# Patient Record
Sex: Male | Born: 1955 | Race: White | Hispanic: No | Marital: Married | Smoking: Former smoker
Health system: Southern US, Community
[De-identification: ages and names within clinical notes are randomized; demographics above are authoritative.]

## PROBLEM LIST (undated history)

## (undated) DIAGNOSIS — K219 Gastro-esophageal reflux disease without esophagitis: Secondary | ICD-10-CM

## (undated) DIAGNOSIS — I10 Essential (primary) hypertension: Secondary | ICD-10-CM

## (undated) DIAGNOSIS — F419 Anxiety disorder, unspecified: Secondary | ICD-10-CM

## (undated) DIAGNOSIS — Z9289 Personal history of other medical treatment: Secondary | ICD-10-CM

## (undated) DIAGNOSIS — M1610 Unilateral primary osteoarthritis, unspecified hip: Secondary | ICD-10-CM

## (undated) DIAGNOSIS — S069X9A Unspecified intracranial injury with loss of consciousness of unspecified duration, initial encounter: Secondary | ICD-10-CM

## (undated) DIAGNOSIS — M1990 Unspecified osteoarthritis, unspecified site: Secondary | ICD-10-CM

## (undated) DIAGNOSIS — J45909 Unspecified asthma, uncomplicated: Secondary | ICD-10-CM

## (undated) HISTORY — PX: LIPOMA EXCISION: SHX5283

---

## 1978-08-27 DIAGNOSIS — Z9289 Personal history of other medical treatment: Secondary | ICD-10-CM

## 1978-08-27 HISTORY — DX: Personal history of other medical treatment: Z92.89

## 1980-08-27 DIAGNOSIS — S069X9A Unspecified intracranial injury with loss of consciousness of unspecified duration, initial encounter: Secondary | ICD-10-CM

## 1980-08-27 HISTORY — PX: TIBIA FRACTURE SURGERY: SHX806

## 1980-08-27 HISTORY — DX: Unspecified intracranial injury with loss of consciousness of unspecified duration, initial encounter: S06.9X9A

## 1981-08-27 HISTORY — PX: TIBIA HARDWARE REMOVAL: SUR1133

## 2015-10-04 ENCOUNTER — Other Ambulatory Visit (HOSPITAL_COMMUNITY): Payer: Self-pay | Admitting: Orthopaedic Surgery

## 2015-10-07 ENCOUNTER — Encounter (HOSPITAL_COMMUNITY)
Admission: RE | Admit: 2015-10-07 | Discharge: 2015-10-07 | Disposition: A | Discharge status: Home / Self Care | Payer: Managed Care, Other (non HMO) | Source: Ambulatory Visit | Attending: Orthopaedic Surgery | Admitting: Orthopaedic Surgery

## 2015-10-07 ENCOUNTER — Encounter (HOSPITAL_COMMUNITY): Payer: Self-pay

## 2015-10-07 DIAGNOSIS — I10 Essential (primary) hypertension: Secondary | ICD-10-CM | POA: Diagnosis not present

## 2015-10-07 DIAGNOSIS — M879 Osteonecrosis, unspecified: Secondary | ICD-10-CM | POA: Diagnosis not present

## 2015-10-07 DIAGNOSIS — M1612 Unilateral primary osteoarthritis, left hip: Secondary | ICD-10-CM | POA: Insufficient documentation

## 2015-10-07 DIAGNOSIS — Z87891 Personal history of nicotine dependence: Secondary | ICD-10-CM | POA: Insufficient documentation

## 2015-10-07 DIAGNOSIS — Z01812 Encounter for preprocedural laboratory examination: Secondary | ICD-10-CM | POA: Insufficient documentation

## 2015-10-07 DIAGNOSIS — Z79899 Other long term (current) drug therapy: Secondary | ICD-10-CM | POA: Diagnosis not present

## 2015-10-07 DIAGNOSIS — Z7982 Long term (current) use of aspirin: Secondary | ICD-10-CM | POA: Diagnosis not present

## 2015-10-07 DIAGNOSIS — Z01818 Encounter for other preprocedural examination: Secondary | ICD-10-CM | POA: Insufficient documentation

## 2015-10-07 DIAGNOSIS — Z0183 Encounter for blood typing: Secondary | ICD-10-CM | POA: Insufficient documentation

## 2015-10-07 DIAGNOSIS — K219 Gastro-esophageal reflux disease without esophagitis: Secondary | ICD-10-CM | POA: Insufficient documentation

## 2015-10-07 DIAGNOSIS — I498 Other specified cardiac arrhythmias: Secondary | ICD-10-CM | POA: Insufficient documentation

## 2015-10-07 HISTORY — DX: Gastro-esophageal reflux disease without esophagitis: K21.9

## 2015-10-07 HISTORY — DX: Essential (primary) hypertension: I10

## 2015-10-07 HISTORY — DX: Unspecified osteoarthritis, unspecified site: M19.90

## 2015-10-07 LAB — CBC WITH DIFFERENTIAL/PLATELET
BASOS PCT: 0 %
Basophils Absolute: 0 10*3/uL (ref 0.0–0.1)
EOS ABS: 0.1 10*3/uL (ref 0.0–0.7)
Eosinophils Relative: 2 %
HCT: 47.5 % (ref 39.0–52.0)
HEMOGLOBIN: 15.5 g/dL (ref 13.0–17.0)
Lymphocytes Relative: 26 %
Lymphs Abs: 1.7 10*3/uL (ref 0.7–4.0)
MCH: 31.5 pg (ref 26.0–34.0)
MCHC: 32.6 g/dL (ref 30.0–36.0)
MCV: 96.5 fL (ref 78.0–100.0)
Monocytes Absolute: 0.5 10*3/uL (ref 0.1–1.0)
Monocytes Relative: 8 %
NEUTROS PCT: 64 %
Neutro Abs: 4.3 10*3/uL (ref 1.7–7.7)
Platelets: 285 10*3/uL (ref 150–400)
RBC: 4.92 MIL/uL (ref 4.22–5.81)
RDW: 13.5 % (ref 11.5–15.5)
WBC: 6.5 10*3/uL (ref 4.0–10.5)

## 2015-10-07 LAB — PROTIME-INR
INR: 1.02 (ref 0.00–1.49)
PROTHROMBIN TIME: 13.6 s (ref 11.6–15.2)

## 2015-10-07 LAB — COMPREHENSIVE METABOLIC PANEL
ALBUMIN: 3.7 g/dL (ref 3.5–5.0)
ALK PHOS: 84 U/L (ref 38–126)
ALT: 22 U/L (ref 17–63)
ANION GAP: 11 (ref 5–15)
AST: 23 U/L (ref 15–41)
BUN: 11 mg/dL (ref 6–20)
CALCIUM: 9.6 mg/dL (ref 8.9–10.3)
CO2: 26 mmol/L (ref 22–32)
CREATININE: 0.96 mg/dL (ref 0.61–1.24)
Chloride: 105 mmol/L (ref 101–111)
GFR calc Af Amer: >60 mL/min (ref >60)
GFR calc non Af Amer: >60 mL/min (ref >60)
GLUCOSE: 104 mg/dL — AB (ref 65–99)
Potassium: 4 mmol/L (ref 3.5–5.1)
SODIUM: 142 mmol/L (ref 135–145)
Total Bilirubin: 0.4 mg/dL (ref 0.3–1.2)
Total Protein: 6.9 g/dL (ref 6.5–8.1)

## 2015-10-07 LAB — SEDIMENTATION RATE: SED RATE: 10 mm/h (ref 0–16)

## 2015-10-07 LAB — URINALYSIS, ROUTINE W REFLEX MICROSCOPIC
BILIRUBIN URINE: NEGATIVE
Glucose, UA: NEGATIVE mg/dL
Hgb urine dipstick: NEGATIVE
KETONES UR: NEGATIVE mg/dL
LEUKOCYTES UA: NEGATIVE
Nitrite: NEGATIVE
PH: 7.5 (ref 5.0–8.0)
Protein, ur: NEGATIVE mg/dL
SPECIFIC GRAVITY, URINE: 1.018 (ref 1.005–1.030)

## 2015-10-07 LAB — TYPE AND SCREEN
ABO/RH(D): A POS
Antibody Screen: NEGATIVE

## 2015-10-07 LAB — ABO/RH: ABO/RH(D): A POS

## 2015-10-07 LAB — APTT: APTT: 29 s (ref 24–37)

## 2015-10-07 LAB — SURGICAL PCR SCREEN
MRSA, PCR: NEGATIVE
Staphylococcus aureus: NEGATIVE

## 2015-10-07 LAB — C-REACTIVE PROTEIN: CRP: 1 mg/dL — AB (ref <1.0)

## 2015-10-07 NOTE — Progress Notes (Signed)
Office called re: severe penicillin allergy as a child. Ancef ordered. Spoke with liz in office

## 2015-10-07 NOTE — Progress Notes (Signed)
   10/07/15 1325  OBSTRUCTIVE SLEEP APNEA  Have you ever been diagnosed with sleep apnea through a sleep study? No  Do you snore loudly (loud enough to be heard through closed doors)?  1  Do you often feel tired, fatigued, or sleepy during the daytime (such as falling asleep during driving or talking to someone)? 0  Has anyone observed you stop breathing during your sleep? 0  Do you have, or are you being treated for high blood pressure? 1  BMI more than 35 kg/m2? 1  Age > 50 (1-yes) 1  Neck circumference greater than:Male 16 inches or larger, Male 17inches or larger? 1 (45)  Male Gender (Yes=1) 1  Obstructive Sleep Apnea Score 6  Score 5 or greater  Results sent to PCP

## 2015-10-07 NOTE — Pre-Procedure Instructions (Addendum)
Micheal Anderson  10/07/2015      [id number] - [place], [place] - [address] [place] [address] Phone: [phone] Fax: [phone]    Your procedure is scheduled on 10/13/15  Report to [facility] Admitting at 1015 A.M.  Call this number if you have problems the morning of surgery:  [phone]   Remember:  Do not eat food or drink liquids after midnight.  Take these medicines the morning of surgery with A SIP OF WATER zantac   STOP all herbel meds, nsaids (aleve,naproxen,advil,ibuprofen) starting TODAY including aspirin, all vitamins, and herbal meds(peppogest, cinnamon,magnilife, coq10, garlic,hawthorn berry ,krill oil, multi vit, milk thistle,tumeric,saw palmetto)   Do not wear jewelry, make-up or nail polish.  Do not wear lotions, powders, or perfumes.  You may wear deodorant.  Do not shave 48 hours prior to surgery.  Men may shave face and neck.  Do not bring valuables to the hospital.  [facility] is not responsible for any belongings or valuables.  Contacts, dentures or bridgework may not be worn into surgery.  Leave your suitcase in the car.  After surgery it may be brought to your room.  For patients admitted to the hospital, discharge time will be determined by your treatment team.  Patients discharged the day of surgery will not be allowed to drive home.   Name and phone number of your driver:    Special instructions:   Special Instructions: [place] - Preparing for Surgery  Before surgery, you can play an important role.  Because skin is not sterile, your skin needs to be as free of germs as possible.  You can reduce the number of germs on you skin by washing with CHG (chlorahexidine gluconate) soap before surgery.  CHG is an antiseptic cleaner which kills germs and bonds with the skin to continue killing germs even after washing.  Please DO NOT use if you have an allergy to CHG or antibacterial soaps.  If your skin  becomes reddened/irritated stop using the CHG and inform your nurse when you arrive at Short Stay.  Do not shave (including legs and underarms) for at least 48 hours prior to the first CHG shower.  You may shave your face.  Please follow these instructions carefully:   1.  Shower with CHG Soap the night before surgery and the morning of Surgery.  2.  If you choose to wash your hair, wash your hair first as usual with your normal shampoo.  3.  After you shampoo, rinse your hair and body thoroughly to remove the Shampoo.  4.  Use CHG as you would any other liquid soap.  You can apply chg directly  to the skin and wash gently with scrungie or a clean washcloth.  5.  Apply the CHG Soap to your body ONLY FROM THE NECK DOWN.  Do not use on open wounds or open sores.  Avoid contact with your eyes ears, mouth and genitals (private parts).  Wash genitals (private parts)       with your normal soap.  6.  Wash thoroughly, paying special attention to the area where your surgery will be performed.  7.  Thoroughly rinse your body with warm water from the neck down.  8.  DO NOT shower/wash with your normal soap after using and rinsing off the CHG Soap.  9.  Pat yourself dry with a clean towel.  10.  Wear clean pajamas.            11.  Place clean sheets on your bed the night of your first shower and do not sleep with pets.  Day of Surgery  Do not apply any lotions/deodorants the morning of surgery.  Please wear clean clothes to the hospital/surgery center.  Please read over the following fact sheets that you were given. Pain Booklet, Coughing and Deep Breathing, Blood Transfusion Information, Total Joint Packet, MRSA Information and Surgical Site Infection Prevention

## 2015-10-10 ENCOUNTER — Other Ambulatory Visit (HOSPITAL_COMMUNITY): Payer: Self-pay | Admitting: Orthopaedic Surgery

## 2015-10-10 NOTE — Progress Notes (Signed)
Anesthesia chart review: Patient is a 60 year old male scheduled for left THA, anterior approach on 10/13/2015 by Dr. Roda Shutters.   History includes former smoker (quit 04/06/15), HTN, GERD, arthritis, childhood asthma. BMI is consistent with obesity. OSA screening score was 6. PCP is listed as Dr. Allyson Sabal [phone]), first/only visit on 09/08/15 with Clovia Cuff, PA-C for HTN and hip pain.   Meds include aspirin, HCTZ, krill oil, potassium, Zantac, saw palmetto, tramadol, multiple herbal supplements (instructed to hold).  PAT Vitals: BP 171/80, HR 102, RR 20, T 36.5C, O2 sat 95%.  10/07/15 EKG: Sinus rhythm at 84 bpm with marked sinus arrhythmia, anterior septal infarct, age undetermined. Rate was more regular and PVC on his comparison tracing from 09/08/15 (PCP).   Preoperative labs noted.  Reviewed above with anesthesiologist Dr. Noreene Larsson. If patient remains asymptomatic from a CV standpoint then it is anticipated that he can proceed as planned.  Velna Ochs [facility] Short Stay Center/Anesthesiology Phone [phone] 10/10/2015 5:14 PM

## 2015-10-12 MED ORDER — BUPIVACAINE LIPOSOME 1.3 % IJ SUSP
20.0000 mL | Freq: Once | INTRAMUSCULAR | Status: DC
  Filled 2015-10-12: qty 20

## 2015-10-12 MED ORDER — VANCOMYCIN HCL 10 G IV SOLR
1500.0000 mg | INTRAVENOUS | Status: AC
  Administered 2015-10-13 (×2): 1500 mg via INTRAVENOUS
  Filled 2015-10-12: qty 1500

## 2015-10-12 MED ORDER — CHLORHEXIDINE GLUCONATE 4 % EX LIQD: 60.0000 mL | Freq: Once | CUTANEOUS | Status: DC

## 2015-10-12 MED ORDER — TRANEXAMIC ACID 1000 MG/10ML IV SOLN
1000.0000 mg | INTRAVENOUS | Status: AC
  Administered 2015-10-13: 1000 mg via INTRAVENOUS
  Filled 2015-10-12: qty 10

## 2015-10-13 ENCOUNTER — Inpatient Hospital Stay (HOSPITAL_COMMUNITY): Payer: Managed Care, Other (non HMO)

## 2015-10-13 ENCOUNTER — Inpatient Hospital Stay (HOSPITAL_COMMUNITY)
Admission: RE | Admit: 2015-10-13 | Discharge: 2015-10-15 | DRG: 470 | Disposition: A | Discharge status: Home Health | Payer: Managed Care, Other (non HMO) | Source: Ambulatory Visit | Attending: Orthopaedic Surgery | Admitting: Orthopaedic Surgery

## 2015-10-13 ENCOUNTER — Encounter (HOSPITAL_COMMUNITY): Payer: Self-pay | Admitting: Certified Registered Nurse Anesthetist

## 2015-10-13 ENCOUNTER — Encounter (HOSPITAL_COMMUNITY)
Admission: RE | Disposition: A | Discharge status: Home Health | Payer: Self-pay | Source: Ambulatory Visit | Attending: Orthopaedic Surgery

## 2015-10-13 ENCOUNTER — Inpatient Hospital Stay (HOSPITAL_COMMUNITY): Payer: Managed Care, Other (non HMO) | Admitting: Certified Registered Nurse Anesthetist

## 2015-10-13 ENCOUNTER — Inpatient Hospital Stay (HOSPITAL_COMMUNITY): Payer: Managed Care, Other (non HMO) | Admitting: Vascular Surgery

## 2015-10-13 DIAGNOSIS — M1612 Unilateral primary osteoarthritis, left hip: Principal | ICD-10-CM | POA: Diagnosis present

## 2015-10-13 DIAGNOSIS — Z79899 Other long term (current) drug therapy: Secondary | ICD-10-CM

## 2015-10-13 DIAGNOSIS — M25552 Pain in left hip: Secondary | ICD-10-CM | POA: Diagnosis present

## 2015-10-13 DIAGNOSIS — I10 Essential (primary) hypertension: Secondary | ICD-10-CM | POA: Diagnosis present

## 2015-10-13 DIAGNOSIS — Z96642 Presence of left artificial hip joint: Secondary | ICD-10-CM

## 2015-10-13 DIAGNOSIS — Z7982 Long term (current) use of aspirin: Secondary | ICD-10-CM | POA: Diagnosis not present

## 2015-10-13 DIAGNOSIS — Z87891 Personal history of nicotine dependence: Secondary | ICD-10-CM

## 2015-10-13 DIAGNOSIS — K219 Gastro-esophageal reflux disease without esophagitis: Secondary | ICD-10-CM | POA: Diagnosis present

## 2015-10-13 DIAGNOSIS — M1611 Unilateral primary osteoarthritis, right hip: Secondary | ICD-10-CM | POA: Diagnosis present

## 2015-10-13 DIAGNOSIS — Z96649 Presence of unspecified artificial hip joint: Secondary | ICD-10-CM

## 2015-10-13 DIAGNOSIS — D62 Acute posthemorrhagic anemia: Secondary | ICD-10-CM | POA: Diagnosis not present

## 2015-10-13 DIAGNOSIS — M879 Osteonecrosis, unspecified: Secondary | ICD-10-CM | POA: Diagnosis present

## 2015-10-13 DIAGNOSIS — Z419 Encounter for procedure for purposes other than remedying health state, unspecified: Secondary | ICD-10-CM

## 2015-10-13 HISTORY — PX: TOTAL HIP ARTHROPLASTY: SHX124

## 2015-10-13 HISTORY — DX: Unspecified asthma, uncomplicated: J45.909

## 2015-10-13 SURGERY — ARTHROPLASTY, HIP, TOTAL, ANTERIOR APPROACH
Anesthesia: Monitor Anesthesia Care | Site: Hip | Laterality: Left

## 2015-10-13 MED ORDER — FENTANYL CITRATE (PF) 100 MCG/2ML IJ SOLN
INTRAMUSCULAR | Status: DC | PRN
  Administered 2015-10-13: 25 ug via INTRAVENOUS
  Administered 2015-10-13: 50 ug via INTRAVENOUS
  Administered 2015-10-13: 25 ug via INTRAVENOUS
  Administered 2015-10-13: 50 ug via INTRAVENOUS

## 2015-10-13 MED ORDER — ALUM & MAG HYDROXIDE-SIMETH 200-200-20 MG/5ML PO SUSP
30.0000 mL | ORAL | Status: DC | PRN
  Administered 2015-10-13: 30 mL via ORAL
  Filled 2015-10-13: qty 30

## 2015-10-13 MED ORDER — HYDROMORPHONE HCL 1 MG/ML IJ SOLN: 0.2500 mg | INTRAMUSCULAR | Status: DC | PRN

## 2015-10-13 MED ORDER — MORPHINE SULFATE (PF) 2 MG/ML IV SOLN
1.0000 mg | INTRAVENOUS | Status: DC | PRN
  Administered 2015-10-13: 1 mg via INTRAVENOUS
  Filled 2015-10-13: qty 1

## 2015-10-13 MED ORDER — SORBITOL 70 % SOLN: 30.0000 mL | Freq: Every day | Status: DC | PRN

## 2015-10-13 MED ORDER — ACETAMINOPHEN 160 MG/5ML PO SOLN: 325.0000 mg | ORAL | Status: DC | PRN

## 2015-10-13 MED ORDER — SODIUM CHLORIDE 0.9 % IV SOLN
INTRAVENOUS | Status: DC
  Administered 2015-10-13: 22:00:00 via INTRAVENOUS

## 2015-10-13 MED ORDER — DIPHENHYDRAMINE HCL 12.5 MG/5ML PO ELIX
25.0000 mg | ORAL_SOLUTION | ORAL | Status: DC | PRN
  Administered 2015-10-13: 25 mg via ORAL
  Filled 2015-10-13: qty 10

## 2015-10-13 MED ORDER — ACETAMINOPHEN 500 MG PO TABS
1000.0000 mg | ORAL_TABLET | Freq: Four times a day (QID) | ORAL | Status: AC
  Filled 2015-10-13: qty 2

## 2015-10-13 MED ORDER — OXYCODONE HCL 5 MG PO TABS: 5.0000 mg | ORAL_TABLET | Freq: Once | ORAL | Status: DC | PRN

## 2015-10-13 MED ORDER — HYDROCHLOROTHIAZIDE 12.5 MG PO CAPS
12.5000 mg | ORAL_CAPSULE | Freq: Every day | ORAL | Status: DC
  Administered 2015-10-14 – 2015-10-15 (×2): 12.5 mg via ORAL
  Filled 2015-10-13 (×2): qty 1

## 2015-10-13 MED ORDER — OXYCODONE HCL ER 10 MG PO T12A
10.0000 mg | EXTENDED_RELEASE_TABLET | Freq: Two times a day (BID) | ORAL | Status: DC
  Administered 2015-10-13 – 2015-10-15 (×4): 10 mg via ORAL
  Filled 2015-10-13 (×4): qty 1

## 2015-10-13 MED ORDER — ONDANSETRON HCL 4 MG PO TABS: 4.0000 mg | ORAL_TABLET | Freq: Four times a day (QID) | ORAL | Status: DC | PRN

## 2015-10-13 MED ORDER — ACETAMINOPHEN 325 MG PO TABS: 325.0000 mg | ORAL_TABLET | ORAL | Status: DC | PRN

## 2015-10-13 MED ORDER — MIDAZOLAM HCL 2 MG/2ML IJ SOLN
INTRAMUSCULAR | Status: AC
  Filled 2015-10-13: qty 2

## 2015-10-13 MED ORDER — ACETAMINOPHEN 160 MG/5ML PO SOLN
325.0000 mg | ORAL | Status: DC | PRN
  Filled 2015-10-13: qty 20.3

## 2015-10-13 MED ORDER — METOCLOPRAMIDE HCL 5 MG PO TABS
5.0000 mg | ORAL_TABLET | Freq: Three times a day (TID) | ORAL | Status: DC | PRN
  Administered 2015-10-13: 10 mg via ORAL
  Filled 2015-10-13: qty 2

## 2015-10-13 MED ORDER — FAMOTIDINE 20 MG PO TABS
20.0000 mg | ORAL_TABLET | Freq: Every day | ORAL | Status: DC
  Administered 2015-10-14 – 2015-10-15 (×2): 20 mg via ORAL
  Filled 2015-10-13 (×2): qty 1

## 2015-10-13 MED ORDER — MIDAZOLAM HCL 5 MG/5ML IJ SOLN
INTRAMUSCULAR | Status: DC | PRN
  Administered 2015-10-13 (×2): 1 mg via INTRAVENOUS

## 2015-10-13 MED ORDER — MAGNESIUM CITRATE PO SOLN: 1.0000 | Freq: Once | ORAL | Status: DC | PRN

## 2015-10-13 MED ORDER — OXYCODONE HCL 5 MG/5ML PO SOLN: 5.0000 mg | Freq: Once | ORAL | Status: DC | PRN

## 2015-10-13 MED ORDER — ACETAMINOPHEN 325 MG PO TABS: 650.0000 mg | ORAL_TABLET | Freq: Four times a day (QID) | ORAL | Status: DC | PRN

## 2015-10-13 MED ORDER — DEXAMETHASONE SODIUM PHOSPHATE 10 MG/ML IJ SOLN
10.0000 mg | Freq: Once | INTRAMUSCULAR | Status: AC
  Administered 2015-10-14: 10 mg via INTRAVENOUS
  Filled 2015-10-13: qty 1

## 2015-10-13 MED ORDER — SODIUM CHLORIDE 0.9 % IR SOLN
Status: DC | PRN
  Administered 2015-10-13: 3000 mL

## 2015-10-13 MED ORDER — CELECOXIB 200 MG PO CAPS
200.0000 mg | ORAL_CAPSULE | Freq: Two times a day (BID) | ORAL | Status: DC
  Filled 2015-10-13: qty 1

## 2015-10-13 MED ORDER — METHOCARBAMOL 750 MG PO TABS: 750.0000 mg | ORAL_TABLET | Freq: Two times a day (BID) | ORAL | Status: AC | PRN

## 2015-10-13 MED ORDER — 0.9 % SODIUM CHLORIDE (POUR BTL) OPTIME
TOPICAL | Status: DC | PRN
  Administered 2015-10-13: 1000 mL

## 2015-10-13 MED ORDER — EPHEDRINE SULFATE 50 MG/ML IJ SOLN
INTRAMUSCULAR | Status: DC | PRN
  Administered 2015-10-13: 5 mg via INTRAVENOUS

## 2015-10-13 MED ORDER — ACETAMINOPHEN 650 MG RE SUPP: 650.0000 mg | Freq: Four times a day (QID) | RECTAL | Status: DC | PRN

## 2015-10-13 MED ORDER — POLYETHYLENE GLYCOL 3350 17 G PO PACK: 17.0000 g | PACK | Freq: Every day | ORAL | Status: DC | PRN

## 2015-10-13 MED ORDER — DEXTROSE 5 % IV SOLN
500.0000 mg | Freq: Four times a day (QID) | INTRAVENOUS | Status: DC | PRN
  Filled 2015-10-13: qty 5

## 2015-10-13 MED ORDER — ASPIRIN EC 325 MG PO TBEC
325.0000 mg | DELAYED_RELEASE_TABLET | Freq: Two times a day (BID) | ORAL | Status: DC
  Administered 2015-10-13 – 2015-10-15 (×4): 325 mg via ORAL
  Filled 2015-10-13 (×4): qty 1

## 2015-10-13 MED ORDER — PHENOL 1.4 % MT LIQD: 1.0000 | OROMUCOSAL | Status: DC | PRN

## 2015-10-13 MED ORDER — PHENYLEPHRINE HCL 10 MG/ML IJ SOLN
10.0000 mg | INTRAVENOUS | Status: DC | PRN
  Administered 2015-10-13: 15 ug/min via INTRAVENOUS

## 2015-10-13 MED ORDER — ONDANSETRON HCL 4 MG/2ML IJ SOLN
INTRAMUSCULAR | Status: DC | PRN
  Administered 2015-10-13: 4 mg via INTRAVENOUS

## 2015-10-13 MED ORDER — OXYCODONE HCL 5 MG PO TABS
5.0000 mg | ORAL_TABLET | ORAL | Status: DC | PRN
  Administered 2015-10-13 – 2015-10-15 (×6): 10 mg via ORAL
  Filled 2015-10-13 (×6): qty 2

## 2015-10-13 MED ORDER — BUPIVACAINE HCL (PF) 0.5 % IJ SOLN
INTRAMUSCULAR | Status: DC | PRN
  Administered 2015-10-13: 3 mL via INTRATHECAL

## 2015-10-13 MED ORDER — FENTANYL CITRATE (PF) 250 MCG/5ML IJ SOLN
INTRAMUSCULAR | Status: AC
  Filled 2015-10-13: qty 5

## 2015-10-13 MED ORDER — OXYCODONE-ACETAMINOPHEN 5-325 MG PO TABS: 1.0000 | ORAL_TABLET | ORAL | Status: DC | PRN

## 2015-10-13 MED ORDER — OXYCODONE HCL 5 MG PO TABS: 5.0000 mg | ORAL_TABLET | ORAL | Status: DC | PRN

## 2015-10-13 MED ORDER — TRANEXAMIC ACID 1000 MG/10ML IV SOLN
2000.0000 mg | INTRAVENOUS | Status: AC
  Administered 2015-10-13: 2000 mg via TOPICAL
  Filled 2015-10-13: qty 20

## 2015-10-13 MED ORDER — ONDANSETRON HCL 4 MG PO TABS: 4.0000 mg | ORAL_TABLET | Freq: Three times a day (TID) | ORAL | Status: DC | PRN

## 2015-10-13 MED ORDER — POVIDONE-IODINE 10 % EX SOLN
CUTANEOUS | Status: DC | PRN
  Administered 2015-10-13: 1 via TOPICAL

## 2015-10-13 MED ORDER — POTASSIUM 99 MG PO TABS: 1.0000 | ORAL_TABLET | Freq: Every day | ORAL | Status: DC

## 2015-10-13 MED ORDER — ASPIRIN EC 325 MG PO TBEC: 325.0000 mg | DELAYED_RELEASE_TABLET | Freq: Two times a day (BID) | ORAL | Status: DC

## 2015-10-13 MED ORDER — VANCOMYCIN HCL 10 G IV SOLR
1500.0000 mg | Freq: Two times a day (BID) | INTRAVENOUS | Status: AC
  Administered 2015-10-13 – 2015-10-14 (×2): 1500 mg via INTRAVENOUS
  Filled 2015-10-13 (×2): qty 1500

## 2015-10-13 MED ORDER — GLYCOPYRROLATE 0.2 MG/ML IJ SOLN
INTRAMUSCULAR | Status: DC | PRN
  Administered 2015-10-13: 0.2 mg via INTRAVENOUS

## 2015-10-13 MED ORDER — ONDANSETRON HCL 4 MG/2ML IJ SOLN: 4.0000 mg | Freq: Four times a day (QID) | INTRAMUSCULAR | Status: DC | PRN

## 2015-10-13 MED ORDER — MENTHOL 3 MG MT LOZG: 1.0000 | LOZENGE | OROMUCOSAL | Status: DC | PRN

## 2015-10-13 MED ORDER — SENNOSIDES-DOCUSATE SODIUM 8.6-50 MG PO TABS: 1.0000 | ORAL_TABLET | Freq: Every evening | ORAL | Status: DC | PRN

## 2015-10-13 MED ORDER — TRANEXAMIC ACID 1000 MG/10ML IV SOLN
1000.0000 mg | Freq: Once | INTRAVENOUS | Status: AC
  Administered 2015-10-13: 1000 mg via INTRAVENOUS
  Filled 2015-10-13: qty 10

## 2015-10-13 MED ORDER — KETOROLAC TROMETHAMINE 30 MG/ML IJ SOLN
30.0000 mg | Freq: Four times a day (QID) | INTRAMUSCULAR | Status: AC | PRN
  Administered 2015-10-13: 30 mg via INTRAVENOUS
  Filled 2015-10-13: qty 1

## 2015-10-13 MED ORDER — PROPOFOL 500 MG/50ML IV EMUL
INTRAVENOUS | Status: DC | PRN
  Administered 2015-10-13: 50 ug/kg/min via INTRAVENOUS

## 2015-10-13 MED ORDER — METOCLOPRAMIDE HCL 5 MG/ML IJ SOLN: 5.0000 mg | Freq: Three times a day (TID) | INTRAMUSCULAR | Status: DC | PRN

## 2015-10-13 MED ORDER — LACTATED RINGERS IV SOLN
INTRAVENOUS | Status: DC
  Administered 2015-10-13 (×3): via INTRAVENOUS

## 2015-10-13 MED ORDER — METHOCARBAMOL 500 MG PO TABS
500.0000 mg | ORAL_TABLET | Freq: Four times a day (QID) | ORAL | Status: DC | PRN
Start: 2015-10-13 — End: 2015-10-15
  Administered 2015-10-13 – 2015-10-15 (×3): 500 mg via ORAL
  Filled 2015-10-13 (×3): qty 1

## 2015-10-13 SURGICAL SUPPLY — 51 items
BENZOIN TINCTURE PRP APPL 2/3 (GAUZE/BANDAGES/DRESSINGS) ×3 IMPLANT
BNDG COHESIVE 6X5 TAN STRL LF (GAUZE/BANDAGES/DRESSINGS) ×3 IMPLANT
CAPT HIP TOTAL 2 ×3 IMPLANT
CELLS DAT CNTRL 66122 CELL SVR (MISCELLANEOUS) ×1 IMPLANT
CLOSURE STERI-STRIP 1/2X4 (GAUZE/BANDAGES/DRESSINGS) ×1
CLSR STERI-STRIP ANTIMIC 1/2X4 (GAUZE/BANDAGES/DRESSINGS) ×2 IMPLANT
COVER SURGICAL LIGHT HANDLE (MISCELLANEOUS) ×3 IMPLANT
DRAPE C-ARM 42X72 X-RAY (DRAPES) ×3 IMPLANT
DRAPE IMP U-DRAPE 54X76 (DRAPES) ×3 IMPLANT
DRAPE STERI IOBAN 125X83 (DRAPES) ×3 IMPLANT
DRAPE U-SHAPE 47X51 STRL (DRAPES) ×9 IMPLANT
DRSG AQUACEL AG ADV 3.5X10 (GAUZE/BANDAGES/DRESSINGS) ×3 IMPLANT
DRSG MEPILEX BORDER 4X8 (GAUZE/BANDAGES/DRESSINGS) IMPLANT
DURAPREP 26ML APPLICATOR (WOUND CARE) ×3 IMPLANT
ELECT BLADE 4.0 EZ CLEAN MEGAD (MISCELLANEOUS) ×3
ELECT REM PT RETURN 9FT ADLT (ELECTROSURGICAL) ×3
ELECTRODE BLDE 4.0 EZ CLN MEGD (MISCELLANEOUS) ×1 IMPLANT
ELECTRODE REM PT RTRN 9FT ADLT (ELECTROSURGICAL) ×1 IMPLANT
FACESHIELD WRAPAROUND (MASK) ×3 IMPLANT
GLOVE SKINSENSE NS SZ7.5 (GLOVE) ×4
GLOVE SKINSENSE STRL SZ7.5 (GLOVE) ×2 IMPLANT
GLOVE SURG SYN 7.5  E (GLOVE) ×2
GLOVE SURG SYN 7.5 E (GLOVE) ×1 IMPLANT
GOWN SRG XL XLNG 56XLVL 4 (GOWN DISPOSABLE) ×1 IMPLANT
GOWN STRL NON-REIN XL XLG LVL4 (GOWN DISPOSABLE) ×2
GOWN STRL REUS W/ TWL LRG LVL3 (GOWN DISPOSABLE) IMPLANT
GOWN STRL REUS W/TWL LRG LVL3 (GOWN DISPOSABLE)
HANDPIECE INTERPULSE COAX TIP (DISPOSABLE) ×2
KIT BASIN OR (CUSTOM PROCEDURE TRAY) ×3 IMPLANT
MARKER SKIN DUAL TIP RULER LAB (MISCELLANEOUS) ×3 IMPLANT
PACK TOTAL JOINT (CUSTOM PROCEDURE TRAY) ×3 IMPLANT
PACK UNIVERSAL I (CUSTOM PROCEDURE TRAY) ×3 IMPLANT
PADDING CAST COTTON 6X4 STRL (CAST SUPPLIES) ×3 IMPLANT
RTRCTR WOUND ALEXIS 18CM MED (MISCELLANEOUS) ×3
SAW OSC TIP CART 19.5X105X1.3 (SAW) ×3 IMPLANT
SEALER BIPOLAR AQUA 6.0 (INSTRUMENTS) IMPLANT
SET HNDPC FAN SPRY TIP SCT (DISPOSABLE) ×1 IMPLANT
SOLUTION BETADINE 4OZ (MISCELLANEOUS) ×3 IMPLANT
SUT ETHIBOND 2 V 37 (SUTURE) ×3 IMPLANT
SUT ETHIBOND NAB CT1 #1 30IN (SUTURE) ×9 IMPLANT
SUT ETHILON 3 0 FSL (SUTURE) ×3 IMPLANT
SUT VIC AB 0 CT1 27 (SUTURE) ×2
SUT VIC AB 0 CT1 27XBRD ANBCTR (SUTURE) ×1 IMPLANT
SUT VIC AB 1 CT1 27 (SUTURE) ×2
SUT VIC AB 1 CT1 27XBRD ANBCTR (SUTURE) ×1 IMPLANT
SUT VIC AB 2-0 CT1 27 (SUTURE) ×4
SUT VIC AB 2-0 CT1 TAPERPNT 27 (SUTURE) ×2 IMPLANT
SYR 20CC LL (SYRINGE) ×3 IMPLANT
TOWEL OR 17X26 10 PK STRL BLUE (TOWEL DISPOSABLE) ×3 IMPLANT
TRAY FOLEY CATH 16FR SILVER (SET/KITS/TRAYS/PACK) ×3 IMPLANT
YANKAUER SUCT BULB TIP NO VENT (SUCTIONS) ×3 IMPLANT

## 2015-10-13 NOTE — Anesthesia Preprocedure Evaluation (Signed)
Anesthesia Evaluation  Patient identified by MRN, date of birth, ID band Patient awake    Reviewed: Allergy & Precautions, NPO status , Patient's Chart, lab work & pertinent test results  History of Anesthesia Complications Negative for: history of anesthetic complications  Airway Mallampati: II  TM Distance: >3 FB Neck ROM: Full    Dental  (+) Teeth Intact,    Pulmonary neg shortness of breath, neg COPD, neg recent URI, former smoker,    breath sounds clear to auscultation       Cardiovascular hypertension, Pt. on medications (-) angina(-) CHF  Rhythm:Regular     Neuro/Psych negative neurological ROS  negative psych ROS   GI/Hepatic Neg liver ROS, GERD  Medicated and Controlled,  Endo/Other  Morbid obesity  Renal/GU negative Renal ROS     Musculoskeletal  (+) Arthritis ,   Abdominal   Peds  Hematology   Anesthesia Other Findings   Reproductive/Obstetrics                             Anesthesia Physical Anesthesia Plan  ASA: II  Anesthesia Plan: MAC and Spinal   Post-op Pain Management:    Induction: Intravenous  Airway Management Planned: Natural Airway, Nasal Cannula and Simple Face Mask  Additional Equipment: None  Intra-op Plan:   Post-operative Plan:   Informed Consent: I have reviewed the patients History and Physical, chart, labs and discussed the procedure including the risks, benefits and alternatives for the proposed anesthesia with the patient or authorized representative who has indicated his/her understanding and acceptance.   Dental advisory given  Plan Discussed with: CRNA and Surgeon  Anesthesia Plan Comments:         Anesthesia Quick Evaluation

## 2015-10-13 NOTE — Op Note (Signed)
LEFT TOTAL HIP ARTHROPLASTY ANTERIOR APPROACH  Procedure Note Micheal Anderson   [id number]  Pre-op Diagnosis: left hip avascular necrosis, osteoarthritis     Post-op Diagnosis: same   Operative Procedures  1. Total hip replacement; Left hip; uncemented [id number]   Personnel  Surgeon(s): Tarry Kos, MD   Anesthesia: spinal  Prosthesis: Depuy Acetabulum: Pinnacle 56 mm Femur: Corail 15 Head: 36 mm size: +12 Bearing Type: Ceramic on +4 lateralized poly  Date of Service: 10/13/2015  Total Hip Arthroplasty (Anterior Approach) Op Note:  After informed consent was obtained and the operative extremity marked in the holding area, the patient was brought back to the operating room and placed supine on the HANA table. Next, the operative extremity was prepped and draped in normal sterile fashion. Surgical timeout occurred verifying patient identification, surgical site, surgical procedure and administration of antibiotics.  A modified anterior Smith-Peterson approach to the hip was performed, using the interval between tensor fascia lata and sartorius.  Dissection was carried bluntly down onto the anterior hip capsule. The lateral femoral circumflex vessels were identified and coagulated. A capsulotomy was performed and the capsular flaps tagged for later repair.  Fluoroscopy was utilized to prepare for the femoral neck cut. The neck osteotomy was performed. The femoral head was removed, the acetabular rim was cleared of soft tissue and attention was turned to reaming the acetabulum.  Sequential reaming was performed under fluoroscopic guidance. We reamed to a size 55 mm, and then impacted the acetabular shell. The liner was then placed after irrigation and attention turned to the femur.  After placing the femoral hook, the leg was taken to externally rotated, extended and adducted position taking care to perform soft tissue releases to allow for adequate mobilization of the femur. Soft tissue was  cleared from the shoulder of the greater trochanter and the hook elevator used to improve exposure of the proximal femur. Sequential broaching performed up to a size 15. Trial neck and head were placed. The leg was brought back up to neutral and the construct reduced. The position and sizing of components, offset and leg lengths were checked using fluoroscopy. Stability of the  construct was checked in extension and external rotation without any subluxation or impingement of prosthesis. We dislocated the prosthesis, dropped the leg back into position, removed trial components, and irrigated copiously. The final stem and head was then placed, the leg brought back up, the system reduced and fluoroscopy used to verify positioning.  We irrigated, obtained hemostasis and closed the capsule using #2 ethibond suture.  Dilute betadyne solution was used. The fascia was closed with #1 vicryl plus, the deep fat layer was closed with 0 vicryl, the subcutaneous layers closed with 2.0 Vicryl Plus and the skin closed with 4.0 monocryl and dermabond. A sterile dressing was applied. The patient was awakened in the operating room and taken to recovery in stable condition.  All sponge, needle, and instrument counts were correct at the end of the case.   Position: supine  Complications: none.  Time Out: performed   Drains/Packing: none  Estimated blood loss: see anesthesia record  Returned to Recovery Room: in good condition.   Antibiotics: yes   Mechanical VTE (DVT) Prophylaxis: sequential compression devices, TED thigh-high  Chemical VTE (DVT) Prophylaxis: aspirin   Fluid Replacement: see anesthesia record  Specimens Removed: 1 to pathology   Sponge and Instrument Count Correct? yes   PACU: portable radiograph - low AP   Admission: inpatient status, start PT &  OT POD#1  Plan/RTC: Return in 2 weeks for staple removal. Return in 6 weeks to see MD.  Weight Bearing/Load Lower Extremity: full  Hip  precautions: none Suture Removal: 10-14 days  Betadine to incision twice daily once dressing is removed on POD#7  N. Glee Arvin, MD [facility] [phone] 2:37 PM      Implant Name Type Inv. Item Serial No. Manufacturer Lot No. LRB No. Used  SCREW 6.5MMX25MM - [id number] Screw SCREW 6.5MMX25MM  DEPUY [id number] Left 1  PINNACLE ALTRX PLUS 4 N 36X56 - [id number] Hips PINNACLE ALTRX PLUS 4 N 36X56  DEPUY [id number] Left 1  PINNACLE ALTRX PLUS 4 N 36X56 - [id number] Hips PINNACLE ALTRX PLUS 4 N 36X56  DEPUY [id number] Left 1  HEAD CERAMIC BIO DELTA 36 - [id number] Hips HEAD CERAMIC BIO DELTA 36  DEPUY [id number] Left 1  SCREW 6.5MMX25MM - [id number] Screw SCREW 6.5MMX25MM  DEPUY [id number] Left 1  HEAD CERAMIC BIO DELTA 36 - [id number] Hips HEAD CERAMIC BIO DELTA 36   DEPUY [id number] Left 1

## 2015-10-13 NOTE — Transfer of Care (Signed)
Immediate Anesthesia Transfer of Care Note  Patient: Micheal Anderson  Procedure(s) Performed: Procedure(s): LEFT TOTAL HIP ARTHROPLASTY ANTERIOR APPROACH (Left)  Patient Location: PACU  Anesthesia Type:MAC and Spinal  Level of Consciousness: awake, alert  and oriented  Airway & Oxygen Therapy: Patient Spontanous Breathing and Patient connected to nasal cannula oxygen  Post-op Assessment: Report given to RN and Post -op Vital signs reviewed and stable  Post vital signs: Reviewed and stable  Last Vitals:  Filed Vitals:   10/13/15 1116  BP: 166/77  Pulse: 96  Temp: 36.8 C  Resp: 20    Complications: No apparent anesthesia complications

## 2015-10-13 NOTE — Anesthesia Procedure Notes (Addendum)
Date/Time: 10/13/2015 12:45 PM Performed by: Dairl Ponder Oxygen Delivery Method: Simple face mask   Spinal Patient location during procedure: OR Staffing Anesthesiologist: Earnestine Shipp Preanesthetic Checklist Completed: patient identified, surgical consent, pre-op evaluation, timeout performed, IV checked, risks and benefits discussed and monitors and equipment checked Spinal Block Patient position: sitting Prep: site prepped and draped and DuraPrep Patient monitoring: heart rate, cardiac monitor, continuous pulse ox and blood pressure Approach: midline Location: L3-4 Injection technique: single-shot Needle Needle type: Pencan  Needle gauge: 24 G Needle length: 10 cm Assessment Sensory level: T8

## 2015-10-13 NOTE — H&P (Signed)
PREOPERATIVE H&P  Chief Complaint: left hip avascular necrosis, osteoarthritis  HPI: Micheal Anderson is a 60 y.o. male who presents for surgical treatment of left hip avascular necrosis, osteoarthritis.  He denies any changes in medical history.  Past Medical History  Diagnosis Date  . Hypertension   . Asthma     as a child  . GERD (gastroesophageal reflux disease)   . Arthritis    Past Surgical History  Procedure Laterality Date  . Fracture surgery  82,83    rt leg pins, pins removed later surgery   Social History   Social History  . Marital Status: Married    Spouse Name: N/A  . Number of Children: N/A  . Years of Education: N/A   Social History Main Topics  . Smoking status: Former Smoker -- 0.25 packs/day for 40 years    Types: Cigarettes    Quit date: 04/06/2015  . Smokeless tobacco: Not on file  . Alcohol Use: 5.4 oz/week    9 Glasses of wine per week  . Drug Use: No  . Sexual Activity: Not on file   Other Topics Concern  . Not on file   Social History Narrative  . No narrative on file   No family history on file. Allergies  Allergen Reactions  . Penicillins Shortness Of Breath    Child-   Difficulty breathing, lips swell   Prior to Admission medications   Medication Sig Start Date End Date Taking? Authorizing Provider  aspirin 81 MG tablet Take 81 mg by mouth daily.   Yes Historical Provider, MD  Cinnamon 500 MG capsule Take 500 mg by mouth daily.   Yes Historical Provider, MD  co-enzyme Q-10 30 MG capsule Take 30 mg by mouth daily.   Yes Historical Provider, MD  GARLIC PO Take 1 capsule by mouth daily.   Yes Historical Provider, MD  Hawthorn Berry 565 MG CAPS Take 1 capsule by mouth 3 (three) times daily.   Yes Historical Provider, MD  hydrochlorothiazide (MICROZIDE) 12.5 MG capsule Take 12.5 mg by mouth daily. 09/08/15  Yes Historical Provider, MD  MEGARED OMEGA-3 KRILL OIL 500 MG CAPS Take 1 capsule by mouth daily.   Yes Historical Provider, MD    milk thistle 175 MG tablet Take 175 mg by mouth daily.   Yes Historical Provider, MD  Multiple Vitamin (MULTIVITAMIN) tablet Take 1 tablet by mouth daily.   Yes Historical Provider, MD  OVER THE COUNTER MEDICATION Take 1 capsule by mouth daily as needed (supplement). "Peppogest" supplement   Yes Historical Provider, MD  OVER THE COUNTER MEDICATION Take 1 capsule by mouth daily. "Magnilife" supplement for Leg pain   Yes Historical Provider, MD  Potassium 99 MG TABS Take 1 tablet by mouth daily.   Yes Historical Provider, MD  ranitidine (ZANTAC) 150 MG capsule Take 150 mg by mouth 2 (two) times daily as needed for heartburn.   Yes Historical Provider, MD  saw palmetto 80 MG capsule Take 80 mg by mouth 2 (two) times daily.   Yes Historical Provider, MD  traMADol (ULTRAM) 50 MG tablet Take 50 mg by mouth every 6 (six) hours as needed. pain 09/26/15  Yes Historical Provider, MD  TURMERIC PO Take 1 capsule by mouth daily.   Yes Historical Provider, MD     Positive ROS: All other systems have been reviewed and were otherwise negative with the exception of those mentioned in the HPI and as above.  Physical Exam: General: Alert, no  acute distress Cardiovascular: No pedal edema Respiratory: No cyanosis, no use of accessory musculature GI: abdomen soft Skin: No lesions in the area of chief complaint Neurologic: Sensation intact distally Psychiatric: Patient is competent for consent with normal mood and affect Lymphatic: no lymphedema  MUSCULOSKELETAL: exam stable  Assessment: left hip avascular necrosis, osteoarthritis  Plan: Plan for Procedure(s): LEFT TOTAL HIP ARTHROPLASTY ANTERIOR APPROACH  The risks benefits and alternatives were discussed with the patient including but not limited to the risks of nonoperative treatment, versus surgical intervention including infection, bleeding, nerve injury,  blood clots, cardiopulmonary complications, morbidity, mortality, among others, and they were  willing to proceed.   Cheral Almas, MD   10/13/2015 7:30 AM

## 2015-10-13 NOTE — Discharge Instructions (Signed)

## 2015-10-14 LAB — BASIC METABOLIC PANEL
Anion gap: 8 (ref 5–15)
BUN: 10 mg/dL (ref 6–20)
CHLORIDE: 104 mmol/L (ref 101–111)
CO2: 24 mmol/L (ref 22–32)
CREATININE: 0.84 mg/dL (ref 0.61–1.24)
Calcium: 8.4 mg/dL — ABNORMAL LOW (ref 8.9–10.3)
GFR calc non Af Amer: >60 mL/min (ref >60)
Glucose, Bld: 106 mg/dL — ABNORMAL HIGH (ref 65–99)
POTASSIUM: 4.1 mmol/L (ref 3.5–5.1)
SODIUM: 136 mmol/L (ref 135–145)

## 2015-10-14 LAB — CBC
HCT: 37.9 % — ABNORMAL LOW (ref 39.0–52.0)
HEMOGLOBIN: 12.7 g/dL — AB (ref 13.0–17.0)
MCH: 32.6 pg (ref 26.0–34.0)
MCHC: 33.5 g/dL (ref 30.0–36.0)
MCV: 97.2 fL (ref 78.0–100.0)
Platelets: 298 10*3/uL (ref 150–400)
RBC: 3.9 MIL/uL — AB (ref 4.22–5.81)
RDW: 13.5 % (ref 11.5–15.5)
WBC: 8 10*3/uL (ref 4.0–10.5)

## 2015-10-14 NOTE — Care Management Note (Signed)
Case Management Note  Patient Details  Name: Micheal Anderson MRN: [id number] Date of Birth: [birth year]  Subjective/Objective:        S/p left total hip arthroplasty            Action/Plan: Spoke with patient about discharge plan. He chose [facility]. Contacted [facility] at [facility] and set up HHPT. Contacted James with [facility], rolling walker and 3N1 delivered to patient's room. Patient stated that his wife and daughter will be bale to assist him after discharge.      Expected Discharge Date:                  Expected Discharge Plan:  Home w Home Health Services  In-House Referral:  NA  Discharge planning Services  CM Consult  Post Acute Care Choice:  Durable Medical Equipment, Home Health Choice offered to:  Patient  DME Arranged:  3-N-1, Walker rolling DME Agency:     HH Arranged:  PT HH Agency:  [facility]  Status of Service:  Completed, signed off  Medicare Important Message Given:    Date Medicare IM Given:    Medicare IM give by:    Date Additional Medicare IM Given:    Additional Medicare Important Message give by:     If discussed at Long Length of Stay Meetings, dates discussed:    Additional Comments:  Monica Becton, RN 10/14/2015, 4:07 PM

## 2015-10-14 NOTE — Progress Notes (Signed)
Utilization review completed.  

## 2015-10-14 NOTE — Progress Notes (Signed)
   Subjective:  Patient reports pain as moderate. Did well with PT  Objective:   VITALS:   Filed Vitals:   10/13/15 1650 10/13/15 2102 10/14/15 0105 10/14/15 0445  BP: 131/77 136/57 137/73 125/66  Pulse: 62 64 70 71  Temp: 97.7 F (36.5 C) 98.5 F (36.9 C) 98.9 F (37.2 C) 98.9 F (37.2 C)  TempSrc: Oral Oral Oral Oral  Resp: Height:      Weight:      SpO2: 100% 98% 99% 99%    Neurologically intact Neurovascular intact Sensation intact distally Intact pulses distally Dorsiflexion/Plantar flexion intact Incision: dressing C/D/I and no drainage No cellulitis present Compartment soft   Lab Results  Component Value Date   WBC 8.0 10/14/2015   HGB 12.7* 10/14/2015   HCT 37.9* 10/14/2015   MCV 97.2 10/14/2015   PLT 298 10/14/2015     Assessment/Plan:  1 Day Post-Op   - Expected postop acute blood loss anemia - will monitor for symptoms - Up with PT/OT - DVT ppx - SCDs, ambulation, asa - WBAT operative extremity - foley out - Pain control - Discharge planning - anticipate dc sat with HHPT  Cheral Almas 10/14/2015, 10:25 AM [phone]

## 2015-10-14 NOTE — Clinical Social Work Note (Signed)
CSW received referral for SNF.  Case discussed with case manager, and plan is to discharge home with home health.  CSW to sign off please re-consult if social work needs arise.  Jalesha Plotz R. Torey Regan, MSW, LCSWA [phone]  

## 2015-10-14 NOTE — Progress Notes (Signed)
   10/14/15 1100  Clinical Encounter Type  Visited With Patient  Visit Type Spiritual support  Referral From Nurse  Spiritual Encounters  Spiritual Needs Prayer  Stress Factors  Patient Stress Factors Health changes  Chaplain visited with patient who feels he is recovering nicely and has good support system at home. Chaplain prayed with patient.

## 2015-10-14 NOTE — Progress Notes (Signed)
Patient with improved mobility during second PT session. Pt ambulating 100 feet with rw and min guard assist. Anticipate D/C home with family assist when medically appropriate. Will attempt stairs prior to D/C.    10/14/15 1531  PT Visit Information  Last PT Received On 10/14/15  Assistance Needed +1  History of Present Illness 60 y.o. 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 PMH: hypertension, arthritis  PT Time Calculation  PT Start Time (ACUTE ONLY) 1450  PT Stop Time (ACUTE ONLY) 1507  PT Time Calculation (min) (ACUTE ONLY) 17 min  Subjective Data  Subjective Doing a lot better this afternoon.   Precautions  Precautions Fall  Precaution Comments HEP provided, no hip precautions  Restrictions  Weight Bearing Restrictions Yes  LLE Weight Bearing WBAT  Pain Assessment  Pain Assessment 0-10  Pain Score 2  Pain Location Lt hip  Pain Descriptors / Indicators Sore  Pain Intervention(s) Limited activity within patient's tolerance;Monitored during session;Ice applied  Cognition  Arousal/Alertness Awake/alert  Behavior During Therapy WFL for tasks assessed/performed  Overall Cognitive Status Within Functional Limits for tasks assessed  Bed Mobility  General bed mobility comments up with OT upon arrival  Transfers  Overall transfer level Needs assistance  Equipment used Rolling walker (2 wheeled)  Transfers Sit to/from Stand  Sit to Stand Min guard  Ambulation/Gait  Ambulation/Gait assistance Min guard  Ambulation Distance (Feet) 100 Feet  Assistive device Rolling walker (2 wheeled)  Gait Pattern/deviations Step-through pattern;Decreased stance time - left  General Gait Details no LOB  Gait velocity decreased  Balance  Overall balance assessment Needs assistance  Sitting-balance support No upper extremity supported  Sitting balance-Leahy Scale Good  Standing balance support Bilateral upper extremity supported  Standing balance-Leahy Scale Poor  Standing  balance comment using rw  Exercises  Exercises Total Joint  Total Joint Exercises  Ankle Circles/Pumps AROM;Both;15 reps  Quad Sets Strengthening;Left;10 reps  Heel Slides AAROM;Left;10 reps  Hip ABduction/ADduction Strengthening;Left;10 reps  PT - End of Session  Equipment Utilized During Treatment Gait belt  Activity Tolerance Patient tolerated treatment well  Patient left in chair;with call bell/phone within reach  Nurse Communication Mobility status;Precautions  PT - Assessment/Plan  PT Plan Current plan remains appropriate  PT Frequency (ACUTE ONLY) 7X/week  Follow Up Recommendations Home health PT;Supervision for mobility/OOB  PT equipment Rolling walker with 5" wheels  PT Goal Progression  Progress towards PT goals Progressing toward goals  Acute Rehab PT Goals  PT Goal Formulation With patient  Time For Goal Achievement 10/28/15  Potential to Achieve Goals Good  PT General Charges  $$ ACUTE PT VISIT 1 Procedure  PT Treatments  $Gait Training 8-22 mins   Christiane Ha, PT, CSCS Pager [phone] Office [phone]

## 2015-10-14 NOTE — Progress Notes (Signed)
Occupational Therapy Evaluation Patient Details Name: Micheal Anderson MRN: [id number] DOB: 01/21/1956 Today's Date: 10/14/2015    History of Present Illness 60 y.o. 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 PMH: hypertension, arthritis   Clinical Impression   PTA, pt was independent with all ADLs, IADLs, and mobility. Pt currently presents with acute L hip pain and required min guard-min assist for functional transfers and LB ADLs. Educated pt on compensatory strategies for ADLs and to gradually increase activity level despite feeling good and less pain. Pt plans to d/c home with 24/7 assistance from his wife. Pt will benefit from continued acute OT to increase independence and safety with ADLs and mobility. Recommending 3in1 and RW for home use.    Follow Up Recommendations  No OT follow up;Supervision - Intermittent    Equipment Recommendations  3 in 1 bedside comode;Other (comment) (RW- 2 wheeled)    Recommendations for Other Services       Precautions / Restrictions Precautions Precautions: Fall Precaution Comments: no hip precautions Restrictions Weight Bearing Restrictions: Yes LLE Weight Bearing: Weight bearing as tolerated      Mobility Bed Mobility Overal bed mobility: Needs Assistance Bed Mobility: Supine to Sit     Supine to sit: Min guard     General bed mobility comments: HOB flat, no use of bedrails and height of bed raised to simulate home environment. Min guard assist for safety. Pt able to progress LLE without assist  Transfers Overall transfer level: Needs assistance Equipment used: Rolling walker (2 wheeled) Transfers: Sit to/from Stand Sit to Stand: Min guard         General transfer comment: Min guard for safety and balance. Verbal cues for safe hand placement on seated surfaces.    Balance Overall balance assessment: Needs assistance Sitting-balance support: No upper extremity supported;Feet supported Sitting balance-Leahy  Scale: Fair     Standing balance support: Bilateral upper extremity supported;During functional activity Standing balance-Leahy Scale: Fair Standing balance comment: using rw                            ADL Overall ADL's : Needs assistance/impaired     Grooming: Wash/dry hands;Min guard;Standing           Upper Body Dressing : Supervision/safety;Sitting   Lower Body Dressing: Minimal assistance;Sitting/lateral leans Lower Body Dressing Details (indicate cue type and reason): Unable to reach bilateral feet Toilet Transfer: Min guard;Cueing for safety;Ambulation;BSC;RW Toilet Transfer Details (indicate cue type and reason): BSC over toilet, verbal cues to feel BSC on back of legs before attempting to sit Toileting- Clothing Manipulation and Hygiene: Min guard;Sit to/from stand       Functional mobility during ADLs: Min guard;Rolling walker General ADL Comments: Reviewed no precautions for movement and began education on compensatory strategies for ADLs. Will need reinforcement to gradually increase activity level.     Vision Vision Assessment?: No apparent visual deficits   Perception     Praxis      Pertinent Vitals/Pain Pain Assessment: 0-10 Pain Score: 2  Pain Location: L hip Pain Descriptors / Indicators: Sore Pain Intervention(s): Limited activity within patient's tolerance;Monitored during session;Repositioned;RN gave pain meds during session     Hand Dominance Right   Extremity/Trunk Assessment Upper Extremity Assessment Upper Extremity Assessment: Overall WFL for tasks assessed   Lower Extremity Assessment Lower Extremity Assessment: LLE deficits/detail LLE Deficits / Details: decreased ROM and strength as expected post op   Cervical /  Trunk Assessment Cervical / Trunk Assessment: Normal   Communication Communication Communication: No difficulties   Cognition Arousal/Alertness: Awake/alert Behavior During Therapy: WFL for tasks  assessed/performed Overall Cognitive Status: Within Functional Limits for tasks assessed                     General Comments       Exercises       Shoulder Instructions      Home Living Family/patient expects to be discharged to:: Private residence Living Arrangements: Spouse/significant other Available Help at Discharge: Available 24 hours/day Type of Home: House Home Access: Stairs to enter [employer] of Steps: 4-5 Entrance Stairs-Rails: Right Home Layout: One level     Bathroom Shower/Tub: Walk-in shower;Door   [employer] Toilet: Standard     Home Equipment: Mudlogger: Sock aid        Prior Functioning/Environment Level of Independence: Independent        Comments: Works as a Production designer, theatre/television/film for [employer] and used to be a Chartered certified accountant Diagnosis: Acute pain   OT Problem List: Decreased range of motion;Decreased strength;Decreased activity tolerance;Impaired balance (sitting and/or standing);Decreased coordination;Decreased safety awareness;Decreased knowledge of use of DME or AE;Pain   OT Treatment/Interventions: Self-care/ADL training;Therapeutic exercise;DME and/or AE instruction;Energy conservation;Therapeutic activities;Patient/family education;Balance training    OT Goals(Current goals can be found in the care plan section) Acute Rehab OT Goals Patient Stated Goal: 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 OT Goal Formulation: With patient Time For Goal Achievement: 10/28/15 Potential to Achieve Goals: Good ADL Goals Pt Will Perform Lower Body Bathing: with modified independence;sitting/lateral leans;with adaptive equipment Pt Will Perform Lower Body Dressing: with modified independence;with adaptive equipment;sitting/lateral leans;sit to/from stand Pt Will Transfer to Toilet: with modified independence;ambulating;bedside commode (with BSC over toilet) Pt Will Perform Toileting - Clothing Manipulation  and hygiene: with modified independence;sitting/lateral leans;sit to/from stand Pt Will Perform Tub/Shower Transfer: Shower transfer;with supervision;ambulating;rolling walker  OT Frequency: Min 2X/week   Barriers to D/C:            Co-evaluation              End of Session Equipment Utilized During Treatment: Gait belt;Rolling walker Nurse Communication: Mobility status  Activity Tolerance: Patient tolerated treatment well Patient left: Other (comment) (with PT)   Time: 1426-1450 OT Time Calculation (min): 24 min Charges:  OT General Charges $OT Visit: 1 Procedure OT Evaluation $OT Eval Moderate Complexity: 1 Procedure OT Treatments $Self Care/Home Management : 8-22 mins G-Codes:    Nils Pyle, OTR/L Pager: [phone] 10/14/2015, 3:21 PM

## 2015-10-14 NOTE — Evaluation (Signed)
Physical Therapy Evaluation Patient Details Name: Micheal Anderson MRN: [id number] DOB: February 13, 1956 Today's Date: 10/14/2015   History of Present Illness  60 y.o. 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 PMH: hypertension, arthritis  Clinical Impression  Pt is s/p direct anterior THA resulting in the deficits listed below (see PT Problem List).  Pt will benefit from skilled PT to increase their independence and safety with mobility to allow discharge to the venue listed below. Patient able to ambulate 8 feet during initial session, will continue to progress as tolerated during future sessions. Anticipate pt will D/C to home with family support when medically stable.     Follow Up Recommendations Home health PT;Supervision for mobility/OOB    Equipment Recommendations  Rolling walker with 5" wheels    Recommendations for Other Services       Precautions / Restrictions Precautions Precautions: Fall Precaution Comments: no hip precautions Restrictions Weight Bearing Restrictions: Yes LLE Weight Bearing: Weight bearing as tolerated      Mobility  Bed Mobility Overal bed mobility: Needs Assistance Bed Mobility: Supine to Sit     Supine to sit: Min assist     General bed mobility comments: cues for sequence and assist with LLE  Transfers Overall transfer level: Needs assistance Equipment used: Rolling walker (2 wheeled) Transfers: Sit to/from Stand Sit to Stand: Min assist         General transfer comment: cues for hand position  Ambulation/Gait Ambulation/Gait assistance: Min guard Ambulation Distance (Feet): 8 Feet Assistive device: Rolling walker (2 wheeled) Gait Pattern/deviations: Step-to pattern;Decreased stance time - left Gait velocity: decreased   General Gait Details: no gross LOB, mild instability  Stairs            Wheelchair Mobility    Modified Rankin (Stroke Patients Only)       Balance Overall balance assessment: Needs  assistance Sitting-balance support: No upper extremity supported Sitting balance-Leahy Scale: Fair     Standing balance support: Bilateral upper extremity supported Standing balance-Leahy Scale: Poor Standing balance comment: using rw                             Pertinent Vitals/Pain Pain Assessment: 0-10 Pain Score: 3  Pain Location: Lt hip Pain Descriptors / Indicators: Aching Pain Intervention(s): Limited activity within patient's tolerance;Monitored during session    Home Living Family/patient expects to be discharged to:: Private residence Living Arrangements: Spouse/significant other Available Help at Discharge: Available 24 hours/day Type of Home: House Home Access: Stairs to enter Entrance Stairs-Rails: Right Entrance Stairs-Number of Steps: 4-5 Home Layout: One level Home Equipment: None      Prior Function Level of Independence: Independent               Hand Dominance        Extremity/Trunk Assessment   Upper Extremity Assessment: Defer to OT evaluation           Lower Extremity Assessment: LLE deficits/detail   LLE Deficits / Details: able to move LLE with min assist     Communication   Communication: No difficulties  Cognition Arousal/Alertness: Awake/alert Behavior During Therapy: WFL for tasks assessed/performed Overall Cognitive Status: Within Functional Limits for tasks assessed                      General Comments      Exercises        Assessment/Plan  PT Assessment Patient needs continued PT services  PT Diagnosis Difficulty walking   PT Problem List Decreased strength;Decreased range of motion;Decreased activity tolerance;Decreased balance;Decreased mobility;Pain  PT Treatment Interventions DME instruction;Gait training;Stair training;Functional mobility training;Therapeutic activities;Therapeutic exercise;Patient/family education   PT Goals (Current goals can be found in the Care Plan section)  Acute Rehab PT Goals Patient Stated Goal: be able to go back home PT Goal Formulation: With patient Time For Goal Achievement: 10/28/15 Potential to Achieve Goals: Good    Frequency 7X/week   Barriers to discharge        Co-evaluation               End of Session Equipment Utilized During Treatment: Gait belt Activity Tolerance: Patient tolerated treatment well Patient left: in chair;with call bell/phone within reach Nurse Communication: Mobility status;Precautions         Time: 1324-4010 PT Time Calculation (min) (ACUTE ONLY): 27 min   Charges:   PT Evaluation $PT Eval Moderate Complexity: 1 Procedure PT Treatments $Gait Training: 8-22 mins   PT G Codes:        Christiane Ha, PT, CSCS Pager [phone] Office [phone]  10/14/2015, 12:17 PM

## 2015-10-15 LAB — CBC
HCT: 41 % (ref 39.0–52.0)
HEMOGLOBIN: 13.2 g/dL (ref 13.0–17.0)
MCH: 31.1 pg (ref 26.0–34.0)
MCHC: 32.2 g/dL (ref 30.0–36.0)
MCV: 96.7 fL (ref 78.0–100.0)
PLATELETS: 322 10*3/uL (ref 150–400)
RBC: 4.24 MIL/uL (ref 4.22–5.81)
RDW: 13.1 % (ref 11.5–15.5)
WBC: 12.1 10*3/uL — AB (ref 4.0–10.5)

## 2015-10-15 NOTE — Discharge Summary (Signed)
2Physician Discharge Summary      Patient ID: RENATO SPELLMAN MRN: [id number] DOB/AGE: August 12, 1956 60 y.o.  Admit date: 10/13/2015 Discharge date: 10/15/2015  Admission Diagnoses:  <principal problem not specified>  Discharge Diagnoses:  Active Problems:   Osteoarthritis of left hip   Hip joint replacement status   Past Medical History  Diagnosis Date  . Hypertension   . GERD (gastroesophageal reflux disease)   . Childhood asthma   . Arthritis     "hips" (10/13/2015)    Surgeries: Procedure(s): LEFT TOTAL HIP ARTHROPLASTY ANTERIOR APPROACH on 10/13/2015   Consultants (if any):    Discharged Condition: Improved  Hospital Course: AUSTIN HERD is an 60 y.o. male who was admitted 10/13/2015 with a diagnosis of <principal problem not specified> and went to the operating room on 10/13/2015 and underwent the above named procedures.    He was given perioperative antibiotics:      Anti-infectives    Start     Dose/Rate Route Frequency Ordered Stop   10/13/15 2330  vancomycin (VANCOCIN) 1,500 mg in sodium chloride 0.9 % 500 mL IVPB     1,500 mg 250 mL/hr over 120 Minutes Intravenous Every 12 hours 10/13/15 1636 10/14/15 1344   10/13/15 1130  vancomycin (VANCOCIN) 1,500 mg in sodium chloride 0.9 % 500 mL IVPB     1,500 mg 250 mL/hr over 120 Minutes Intravenous To [facility] 10/12/15 0759 10/13/15 1228    .  He was given sequential compression devices, early ambulation, and aspirin for DVT prophylaxis.  He benefited maximally from the hospital stay and there were no complications.    Recent vital signs:  Filed Vitals:   10/14/15 2049 10/15/15 0511  BP: 109/66 130/65  Pulse: 77 58  Temp: 98.3 F (36.8 C) 98.3 F (36.8 C)  Resp: 18 16    Recent laboratory studies:  Lab Results  Component Value Date   HGB 13.2 10/15/2015   HGB 12.7* 10/14/2015   HGB 15.5 10/07/2015   Lab Results  Component Value Date   WBC 12.1* 10/15/2015   PLT 322 10/15/2015   Lab  Results  Component Value Date   INR 1.02 10/07/2015   Lab Results  Component Value Date   NA 136 10/14/2015   K 4.1 10/14/2015   CL 104 10/14/2015   CO2 24 10/14/2015   BUN 10 10/14/2015   CREATININE 0.84 10/14/2015   GLUCOSE 106* 10/14/2015    Discharge Medications:     Medication List    STOP taking these medications        aspirin 81 MG tablet  Replaced by:  aspirin EC 325 MG tablet      TAKE these medications        aspirin EC 325 MG tablet  Take 1 tablet (325 mg total) by mouth 2 (two) times daily.     Cinnamon 500 MG capsule  Take 500 mg by mouth daily.     co-enzyme Q-10 30 MG capsule  Take 30 mg by mouth daily.     GARLIC PO  Take 1 capsule by mouth daily.     Hawthorn Berry 565 MG Caps  Take 1 capsule by mouth 3 (three) times daily.     hydrochlorothiazide 12.5 MG capsule  Commonly known as:  MICROZIDE  Take 12.5 mg by mouth daily.     MEGARED OMEGA-3 KRILL OIL 500 MG Caps  Take 1 capsule by mouth daily.     methocarbamol 750 MG tablet  Commonly known  as:  ROBAXIN  Take 1 tablet (750 mg total) by mouth 2 (two) times daily as needed for muscle spasms.     milk thistle 175 MG tablet  Take 175 mg by mouth daily.     multivitamin tablet  Take 1 tablet by mouth daily.     ondansetron 4 MG tablet  Commonly known as:  ZOFRAN  Take 1-2 tablets (4-8 mg total) by mouth every 8 (eight) hours as needed for nausea or vomiting.     OVER THE COUNTER MEDICATION  Take 1 capsule by mouth daily as needed (supplement). "Peppogest" supplement     OVER THE COUNTER MEDICATION  Take 1 capsule by mouth daily. "Magnilife" supplement for Leg pain     oxyCODONE 5 MG immediate release tablet  Commonly known as:  Oxy IR/ROXICODONE  Take 1-3 tablets (5-15 mg total) by mouth every 4 (four) hours as needed.     oxyCODONE-acetaminophen 5-325 MG tablet  Commonly known as:  PERCOCET  Take 1-2 tablets by mouth every 4 (four) hours as needed for severe pain.      Potassium 99 MG Tabs  Take 1 tablet by mouth daily.     ranitidine 150 MG capsule  Commonly known as:  ZANTAC  Take 150 mg by mouth 2 (two) times daily as needed for heartburn.     saw palmetto 80 MG capsule  Take 80 mg by mouth 2 (two) times daily.     senna-docusate 8.6-50 MG tablet  Commonly known as:  SENOKOT S  Take 1 tablet by mouth at bedtime as needed.     traMADol 50 MG tablet  Commonly known as:  ULTRAM  Take 50 mg by mouth every 6 (six) hours as needed. pain     TURMERIC PO  Take 1 capsule by mouth daily.        Diagnostic Studies: Dg Pelvis Portable  10/13/2015  CLINICAL DATA:  Left hip replacement post op. EXAM: PORTABLE PELVIS 1-2 VIEWS COMPARISON:  10/13/2015 FINDINGS: Patient has undergone left total hip arthroplasty. No evidence for dislocation on this frontal view provided. Significant degenerative changes are noted in the right hip. IMPRESSION: Status post left hip arthroplasty.  No adverse features. Electronically Signed   By: Norva Pavlov M.D.   On: 10/13/2015 15:39   Dg Hip Operative Unilat W Or W/o Pelvis Left  10/13/2015  CLINICAL DATA:  Left total hip arthroplasty with anterior approach. EXAM: OPERATIVE LEFT HIP (WITH PELVIS IF PERFORMED) TECHNIQUE: Fluoroscopic spot image(s) were submitted for interpretation post-operatively. COMPARISON:  None. FINDINGS: Four intraoperative spot fluoroscopic images of the lower pelvis and left hip are provided. The initial image demonstrates advanced degenerative change of the left hip. Subsequent images demonstrate performance of a left total hip arthroplasty with the prosthetic components appearing normally located on these limited images. IMPRESSION: Intraoperative images during left total hip arthroplasty. Electronically Signed   By: Sebastian Ache M.D.   On: 10/13/2015 14:41    Disposition: Final discharge disposition not confirmed    Follow-up Information    Follow up with Cheral Almas, MD In 2 weeks.    Specialty:  Orthopedic Surgery   Why:  For suture removal, For wound re-check   Contact information:   [address] [place] [address] [phone]       Follow up with [facility].   Why:  They will contact you to schedule homr therapy visits.   Contact information:   [address] [employer]  [place] [address] [phone]        Signed: Cheral Almas 10/15/2015, 7:21 AM

## 2015-10-15 NOTE — Anesthesia Postprocedure Evaluation (Signed)
Anesthesia Post Note  Patient: Micheal Anderson  Procedure(s) Performed: Procedure(s) (LRB): LEFT TOTAL HIP ARTHROPLASTY ANTERIOR APPROACH (Left)  Patient location during evaluation: PACU Anesthesia Type: Spinal Level of consciousness: awake Pain management: pain level controlled Vital Signs Assessment: post-procedure vital signs reviewed and stable Respiratory status: spontaneous breathing Cardiovascular status: stable Postop Assessment: spinal receding and no signs of nausea or vomiting Anesthetic complications: no    Last Vitals:  Filed Vitals:   10/14/15 2049 10/15/15 0511  BP: 109/66 130/65  Pulse: 77 58  Temp: 36.8 C 36.8 C  Resp: 18 16    Last Pain:  Filed Vitals:   10/15/15 0829  PainSc: 6                  Deacon Gadbois

## 2015-10-15 NOTE — Progress Notes (Signed)
Occupational Therapy Treatment Patient Details Name: NORM WRAY MRN: [id number] DOB: 08/22/1956 Today's Date: 10/15/2015    History of present illness 60 y.o. 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 PMH: hypertension, arthritis   OT comments  Pt. Making gains with acute OT goals.  Completing functional mobility and transfers with S.  Clear for d/c from OT with d/c home set for later today.  Will alert OTR/L to sign off.    Follow Up Recommendations  No OT follow up;Supervision - Intermittent    Equipment Recommendations  3 in 1 bedside comode;Other (comment)    Recommendations for Other Services      Precautions / Restrictions Restrictions Weight Bearing Restrictions: Yes LLE Weight Bearing: Weight bearing as tolerated       Mobility Bed Mobility Overal bed mobility: Needs Assistance Bed Mobility: Supine to Sit     Supine to sit: Supervision     General bed mobility comments: hob flat, no rails, exits on R side at home  Transfers Overall transfer level: Needs assistance   Transfers: Sit to/from Stand;Stand Pivot Transfers Sit to Stand: Supervision Stand pivot transfers: Supervision            Balance                                   ADL Overall ADL's : Needs assistance/impaired               Lower Body Bathing Details (indicate cue type and reason): reports he has all A/E no need for review       Lower Body Dressing Details (indicate cue type and reason): reports he has all A/E no need for review Toilet Transfer: Supervision/safety;Ambulation;RW;BSC;Regular Glass blower/designer Details (indicate cue type and reason): 3n1 over the commode Toileting- Clothing Manipulation and Hygiene: Supervision/safety;Sit to/from stand   Tub/ Banker: Walk-in shower;Anterior/posterior;Ambulation;3 in 1;Supervision/safety   Functional mobility during ADLs: Supervision/safety;Rolling walker General ADL Comments: able  to complete all OT goals, clear for d/c home today      Vision                     Perception     Praxis      Cognition   Behavior During Therapy: Anxious Overall Cognitive Status: Within Functional Limits for tasks assessed                       Extremity/Trunk Assessment               Exercises     Shoulder Instructions       General Comments      Pertinent Vitals/ Pain       Pain Assessment: No/denies pain Pain Intervention(s): Premedicated before session  Home Living                                          Prior Functioning/Environment              Frequency Min 2X/week     Progress Toward Goals  OT Goals(current goals can now be found in the care plan section)  Progress towards OT goals: Goals met/education completed, patient discharged from [facility] Discharge plan remains appropriate    Co-evaluation  End of Session Equipment Utilized During Treatment: Gait belt;Rolling walker   Activity Tolerance Patient tolerated treatment well   Patient Left Other (comment) (with PT)   Nurse Communication          Time: 5997-7414 OT Time Calculation (min): 22 min  Charges: OT General Charges $OT Visit: 1 Procedure OT Treatments $Self Care/Home Management : 8-22 mins  Janice Coffin, COTA/L 10/15/2015, 9:34 AM

## 2015-10-15 NOTE — Progress Notes (Signed)
Subjective: Patient stable doing well in therapy ambulating   Objective: Vital signs in last 24 hours: Temp:  [98.3 F (36.8 C)-98.4 F (36.9 C)] 98.3 F (36.8 C) (02/18 0511) Pulse Rate:  [58-90] 58 (02/18 0511) Resp:  [16-18] 16 (02/18 0511) BP: (109-147)/(65-71) 130/65 mmHg (02/18 0511) SpO2:  [98 %-99 %] 99 % (02/18 0511)  Intake/Output from previous day: 02/17 0701 - 02/18 0700 In: 560 [P.O.:560] Out: -  Intake/Output this shift: Total I/O In: 240 [P.O.:240] Out: -   Exam:  Intact pulses distally Dorsiflexion/Plantar flexion intact  Labs:  Recent Labs  10/14/15 0620 10/15/15 0423  HGB 12.7* 13.2    Recent Labs  10/14/15 0620 10/15/15 0423  WBC 8.0 12.1*  RBC 3.90* 4.24  HCT 37.9* 41.0  PLT 298 322    Recent Labs  10/14/15 0620  NA 136  K 4.1  CL 104  CO2 24  BUN 10  CREATININE 0.84  GLUCOSE 106*  CALCIUM 8.4*   No results for input(s): LABPT, INR in the last 72 hours.  Assessment/Plan: Plan discharge today   Micheal Anderson 10/15/2015, 9:56 AM

## 2015-10-15 NOTE — Progress Notes (Signed)
Micheal Anderson discharged home per MD order. Discharge instructions reviewed and discussed with patient. All questions and concerns answered. Copy of instructions and scripts given to patient. IV removed.  Patient escorted to car by staff in a wheelchair. No distress noted upon discharge.   Rosita Fire 10/15/2015 1:57 PM

## 2015-10-15 NOTE — Progress Notes (Signed)
Physical Therapy Treatment Patient Details Name: Micheal Anderson MRN: [id number] DOB: 1955/11/13 Today's Date: 10/15/2015    History of Present Illness 60 y.o. 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 PMH: hypertension, arthritis    PT Comments    Pt making excellent progress.  Performed stairs without difficulty.   I have encouraged the patient to gradually increase activity daily to tolerance.  I have answered all patient's question regarding PT and mobility.   Pt feels ready for DC home today.    Follow Up Recommendations  Home health PT;Supervision for mobility/OOB     Equipment Recommendations  Rolling walker with 5" wheels    Recommendations for Other Services       Precautions / Restrictions Precautions Precautions: Fall Precaution Comments: HEP provided, no hip precautions Restrictions Weight Bearing Restrictions: Yes LLE Weight Bearing: Weight bearing as tolerated    Mobility  Bed Mobility Overal bed mobility: Needs Assistance Bed Mobility: Sit to Supine     Supine to sit: Supervision Sit to supine: Min assist (Assist with LLE)   General bed mobility comments: up with OT upon arrival  Transfers Overall transfer level: Needs assistance Equipment used: Rolling walker (2 wheeled) Transfers: Sit to/from Stand Sit to Stand: Supervision Stand pivot transfers: Supervision       General transfer comment: demonstrated proper technique  Ambulation/Gait Ambulation/Gait assistance: Supervision Ambulation Distance (Feet): 120 Feet Assistive device: Rolling walker (2 wheeled) Gait Pattern/deviations: Step-to pattern;Decreased stride length Gait velocity: decreased   General Gait Details: no LOB   Stairs            Wheelchair Mobility    Modified Rankin (Stroke Patients Only)       Balance     Sitting balance-Leahy Scale: Good                              Cognition Arousal/Alertness: Awake/alert Behavior During  Therapy: WFL for tasks assessed/performed Overall Cognitive Status: Within Functional Limits for tasks assessed                      Exercises Total Joint Exercises Ankle Circles/Pumps: AROM;Both;10 reps;Supine Short Arc Quad: AROM;Left;10 reps;Supine Heel Slides: AAROM;Left;10 reps;Supine Hip ABduction/ADduction: Left;10 reps;AROM;Supine    General Comments General comments (skin integrity, edema, etc.): Wife present for part of treatment seesion in room      Pertinent Vitals/Pain Pain Assessment: 0-10 Pain Score: 2  Pain Location: left anterior thigh Pain Descriptors / Indicators: Sore Pain Intervention(s): Limited activity within patient's tolerance    Home Living                      Prior Function            PT Goals (current goals can now be found in the care plan section) Progress towards PT goals: Progressing toward goals    Frequency  7X/week    PT Plan Current plan remains appropriate    Co-evaluation             End of Session Equipment Utilized During Treatment: Gait belt Activity Tolerance: Patient tolerated treatment well Patient left: with call bell/phone within reach;in bed;with family/visitor present     Time: 4098-1191 PT Time Calculation (min) (ACUTE ONLY): 41 min  Charges:  $Gait Training: 23-37 mins $Therapeutic Exercise: 8-22 mins  G Codes:      Donnella Sham Nov 13, 2015, 10:13 AM [place], PT  [phone] Nov 13, 2015

## 2015-10-17 ENCOUNTER — Encounter (HOSPITAL_COMMUNITY): Payer: Self-pay | Admitting: Orthopaedic Surgery

## 2016-10-08 ENCOUNTER — Ambulatory Visit (INDEPENDENT_AMBULATORY_CARE_PROVIDER_SITE_OTHER): Payer: Managed Care, Other (non HMO) | Admitting: Orthopaedic Surgery

## 2016-10-08 ENCOUNTER — Ambulatory Visit (INDEPENDENT_AMBULATORY_CARE_PROVIDER_SITE_OTHER): Payer: Self-pay

## 2016-10-08 ENCOUNTER — Encounter (INDEPENDENT_AMBULATORY_CARE_PROVIDER_SITE_OTHER): Payer: Self-pay | Admitting: Orthopaedic Surgery

## 2016-10-08 DIAGNOSIS — M25552 Pain in left hip: Secondary | ICD-10-CM | POA: Diagnosis not present

## 2016-10-08 NOTE — Progress Notes (Signed)
   Office Visit Note   Patient: Micheal Anderson           Date of Birth: 04/19/1956           MRN: [id number] Visit Date: 10/08/2016              Requested by: Allyson SabalJohn Woodyear, MD No address on file PCP: Allyson SabalWOODYEAR,JOHN, MD   Assessment & Plan: Visit Diagnoses:  1. Pain in left hip     Plan: 1 year THA follow up plan  Patient is now one year out from a total hip arthroplasty. Patient is doing very well and very pleased with the results. Radiographs reveal a total hip arthroplasty in good position, with no evidence of subsidence, loosening, or complicating features. It was reinforced that prophylactic antibiotics should be taken with any procedure including but not limited to dental work or colonoscopies. We plan to follow them at 1 year intervals at this time with radiographs at each visit, and as always we should be notified with any questions or concerns in the interim.  Follow-Up Instructions: No Follow-up on file.   Orders:  Orders Placed This Encounter  Procedures  . XR Pelvis 1-2 Views   No orders of the defined types were placed in this encounter.     Procedures: No procedures performed   Clinical Data: No additional findings.   Subjective: Chief Complaint  Patient presents with  . Left Hip - Follow-up    1 year f/u visit s/p left THA.  Doing very well.  Happy with surgery.  Resumed all activities.    Review of Systems   Objective: Vital Signs: There were no vitals taken for this visit.  Physical Exam  Ortho Exam Left hip exam is benign.  Walks normally Specialty Comments:  No specialty comments available.  Imaging: No results found.   PMFS History: Patient Active Problem List   Diagnosis Date Noted  . Osteoarthritis of left hip 10/13/2015  . Hip joint replacement status 10/13/2015   Past Medical History:  Diagnosis Date  . Arthritis    "hips" (10/13/2015)  . Childhood asthma   . GERD (gastroesophageal reflux disease)   . Hypertension       No family history on file.  Past Surgical History:  Procedure Laterality Date  . JOINT REPLACEMENT    . TIBIA FRACTURE SURGERY Right 1982   "put pins in"  . TIBIA HARDWARE REMOVAL Right 1983   "took pins out"  . TOTAL HIP ARTHROPLASTY Left 10/13/2015  . TOTAL HIP ARTHROPLASTY Left 10/13/2015   Procedure: LEFT TOTAL HIP ARTHROPLASTY ANTERIOR APPROACH;  Surgeon: Tarry KosNaiping M Xu, MD;  Location: [facility];  Service: Orthopedics;  Laterality: Left;   Social History   Occupational History  . Not on file.   Social History Main Topics  . Smoking status: Former Smoker    Packs/day: 0.25    Years: 40.00    Types: Cigarettes    Quit date: 04/06/2015  . Smokeless tobacco: Current User    Types: Chew     Comment: 10/13/2015 "chew maybe once or twice/month"  . Alcohol use 5.4 oz/week    9 Glasses of wine per week  . Drug use: No  . Sexual activity: Yes

## 2017-10-08 ENCOUNTER — Ambulatory Visit (INDEPENDENT_AMBULATORY_CARE_PROVIDER_SITE_OTHER): Payer: 59

## 2017-10-08 ENCOUNTER — Encounter (INDEPENDENT_AMBULATORY_CARE_PROVIDER_SITE_OTHER): Payer: Self-pay | Admitting: Orthopaedic Surgery

## 2017-10-08 ENCOUNTER — Ambulatory Visit (INDEPENDENT_AMBULATORY_CARE_PROVIDER_SITE_OTHER): Payer: 59 | Admitting: Orthopaedic Surgery

## 2017-10-08 DIAGNOSIS — M1611 Unilateral primary osteoarthritis, right hip: Secondary | ICD-10-CM

## 2017-10-08 DIAGNOSIS — Z96642 Presence of left artificial hip joint: Secondary | ICD-10-CM | POA: Diagnosis not present

## 2017-10-08 NOTE — Progress Notes (Signed)
Office Visit Note   Patient: Micheal Anderson           Date of Birth: February 27, 1956           MRN: [id number] Visit Date: 10/08/2017              Requested by: Allyson Sabal, MD No address on file PCP: Allyson Sabal, MD   Assessment & Plan: Visit Diagnoses:  1. Status post total hip replacement, left   2. Primary localized osteoarthritis of right hip     Plan: In regards to the left hip we are pleased with Micheal Anderson's progress.  In regards to the right hip we are going to go ahead and set him up for an anterior approach total hip arthroplasty.  He will call Cordelia Pen with dates for when he can proceed.  Risks benefits possible comp occasions reviewed.  Rehab care discussed with all questions were answered.  Follow-Up Instructions: Return for post-op for right total hip replacement.   Orders:  Orders Placed This Encounter  Procedures  . XR HIP UNILAT W OR W/O PELVIS 2-3 VIEWS LEFT   No orders of the defined types were placed in this encounter.     Procedures: No procedures performed   Clinical Data: No additional findings.   Subjective: Chief Complaint  Patient presents with  . Left Hip - Pain    HPI Micheal Anderson comes in for follow-up.  2 years status post left anterior total hip replacement date of surgery 10/13/2015.  He is doing excellent.  No pain.  Full range of motion and strength.  He does however admit to right hip pain.  History of osteoarthritis there.  This is progressively worsened and he is having days where the pain is so bad he can hardly stand.  Still known pain at night.  At this point he would like to proceed with a right total hip replacement, but is unsure of what dates will work best.  Review of Systems as detailed in HPI.  All others reviewed and are negative.   Objective: Vital Signs: There were no vitals taken for this visit.  Physical Exam well-developed well-nourished gentleman in no acute distress.  Alert and oriented x3.  Ortho Exam examination of  his left hip reveals negative logroll.  Full hip flexion with and without resistance.  Examination of his right hip reveals markedly positive logroll with decreased internal rotation.  Positive Stinchfield.  Specialty Comments:  No specialty comments available.  Imaging: Xr Hip Unilat W Or W/o Pelvis 2-3 Views Left  Result Date: 10/08/2017 X-rays of the left hip reveal a well-seated prosthesis without evidence of subsidence or osteolysis.  Right hip reveals marked degenerative changes with significant periarticular spurring.    PMFS History: Patient Active Problem List   Diagnosis Date Noted  . Primary localized osteoarthritis of right hip 10/13/2015  . Status post total hip replacement, left 10/13/2015   Past Medical History:  Diagnosis Date  . Arthritis    "hips" (10/13/2015)  . Childhood asthma   . GERD (gastroesophageal reflux disease)   . Hypertension     History reviewed. No pertinent family history.  Past Surgical History:  Procedure Laterality Date  . JOINT REPLACEMENT    . TIBIA FRACTURE SURGERY Right 1982   "put pins in"  . TIBIA HARDWARE REMOVAL Right 1983   "took pins out"  . TOTAL HIP ARTHROPLASTY Left 10/13/2015  . TOTAL HIP ARTHROPLASTY Left 10/13/2015   Procedure: LEFT TOTAL HIP ARTHROPLASTY ANTERIOR APPROACH;  Surgeon: Tarry KosNaiping M Xu, MD;  [facility];  Service: Orthopedics;  Laterality: Left;   Social History   Occupational History  . Not on file  Tobacco Use  . Smoking status: Former Smoker    Packs/day: 0.25    Years: 40.00    Pack years: 10.00    Types: Cigarettes    Last attempt to quit: 04/06/2015    Years since quitting: 2.5  . Smokeless tobacco: Current User    Types: Chew  . Tobacco comment: 10/13/2015 "chew maybe once or twice/month"  Substance and Sexual Activity  . Alcohol use: Yes    Alcohol/week: 5.4 oz    Types: 9 Glasses of wine per week  . Drug use: No  . Sexual activity: Yes

## 2017-12-09 ENCOUNTER — Telehealth (INDEPENDENT_AMBULATORY_CARE_PROVIDER_SITE_OTHER): Payer: Self-pay | Admitting: Orthopaedic Surgery

## 2017-12-09 NOTE — Telephone Encounter (Signed)
Patient is ready to proceed with hip replacement. CB # [phone]

## 2017-12-11 NOTE — Telephone Encounter (Signed)
Spoke with patient and scheduled surgery. °

## 2017-12-19 NOTE — Pre-Procedure Instructions (Signed)
    Micheal Anderson  12/19/2017      [id number] - [place], [place] - [address] [facility] [place] [id number] Phone: [phone] Fax: [phone]    Your procedure is scheduled on Thurs., Dec 26, 2017  [facility]" at 10:15AM  Call this number if you have problems the morning of surgery:  [phone]   Remember:  Do not eat food or drink liquids after midnight.  Take these medicines the morning of surgery with A SIP OF WATER: BusPIRone (BUSPAR) and ARTIFICIAL tears. If needed Methocarbamol (ROBAXIN) for spasms, Ranitidine (ZANTAC) for acid reflux, TraMADol (ULTRAM) for pain, and Sodium chloride (OCEAN) nasal spray for congestion.  As of today, stop taking all Aspirins, Vitamins, Fish oils, and Herbal medications. Also stop all NSAIDS i.e. Advil, Ibuprofen, Motrin, Aleve, Anaprox, Naproxen, BC and Goody Powders.   Do not wear jewelry.  Do not wear lotions, powders,colognes, or deodorant.  Do not shave 48 hours prior to surgery.  Men may shave face.  Do not bring valuables to the hospital.  [facility] is not responsible for any belongings or valuables.  Contacts, dentures or bridgework may not be worn into surgery.  Leave your suitcase in the car.  After surgery it may be brought to your room.  For patients admitted to the hospital, discharge time will be determined by your treatment team.  Patients discharged the day of surgery will not be allowed to drive home.    Please read over the following fact sheets that you were given. Pain Booklet, Coughing and Deep Breathing, Total Joint Packet, MRSA Information and Surgical Site Infection Prevention

## 2017-12-20 ENCOUNTER — Encounter (HOSPITAL_COMMUNITY)
Admission: RE | Admit: 2017-12-20 | Discharge: 2017-12-20 | Disposition: A | Discharge status: Home / Self Care | Payer: 59 | Source: Ambulatory Visit | Attending: Orthopaedic Surgery | Admitting: Orthopaedic Surgery

## 2017-12-20 ENCOUNTER — Ambulatory Visit (HOSPITAL_COMMUNITY)
Admission: RE | Admit: 2017-12-20 | Discharge: 2017-12-20 | Disposition: A | Discharge status: Home / Self Care | Payer: 59 | Source: Ambulatory Visit | Attending: Physician Assistant | Admitting: Physician Assistant

## 2017-12-20 ENCOUNTER — Other Ambulatory Visit: Payer: Self-pay

## 2017-12-20 ENCOUNTER — Encounter (HOSPITAL_COMMUNITY): Payer: Self-pay

## 2017-12-20 DIAGNOSIS — M1611 Unilateral primary osteoarthritis, right hip: Secondary | ICD-10-CM

## 2017-12-20 DIAGNOSIS — Z01812 Encounter for preprocedural laboratory examination: Secondary | ICD-10-CM | POA: Diagnosis not present

## 2017-12-20 DIAGNOSIS — Z01810 Encounter for preprocedural cardiovascular examination: Secondary | ICD-10-CM | POA: Insufficient documentation

## 2017-12-20 HISTORY — DX: Personal history of other medical treatment: Z92.89

## 2017-12-20 HISTORY — DX: Anxiety disorder, unspecified: F41.9

## 2017-12-20 HISTORY — DX: Unspecified intracranial injury with loss of consciousness of unspecified duration, initial encounter: S06.9X9A

## 2017-12-20 LAB — CBC WITH DIFFERENTIAL/PLATELET
BASOS ABS: 0 10*3/uL (ref 0.0–0.1)
BASOS PCT: 1 %
Eosinophils Absolute: 0.2 10*3/uL (ref 0.0–0.7)
Eosinophils Relative: 4 %
HCT: 47.4 % (ref 39.0–52.0)
Hemoglobin: 15.8 g/dL (ref 13.0–17.0)
Lymphocytes Relative: 38 %
Lymphs Abs: 2 10*3/uL (ref 0.7–4.0)
MCH: 32.4 pg (ref 26.0–34.0)
MCHC: 33.3 g/dL (ref 30.0–36.0)
MCV: 97.3 fL (ref 78.0–100.0)
MONO ABS: 0.6 10*3/uL (ref 0.1–1.0)
Monocytes Relative: 11 %
Neutro Abs: 2.5 10*3/uL (ref 1.7–7.7)
Neutrophils Relative %: 46 %
PLATELETS: 269 10*3/uL (ref 150–400)
RBC: 4.87 MIL/uL (ref 4.22–5.81)
RDW: 14.1 % (ref 11.5–15.5)
WBC: 5.4 10*3/uL (ref 4.0–10.5)

## 2017-12-20 LAB — PROTIME-INR
INR: 0.95
Prothrombin Time: 12.6 seconds (ref 11.4–15.2)

## 2017-12-20 LAB — COMPREHENSIVE METABOLIC PANEL
ALBUMIN: 3.8 g/dL (ref 3.5–5.0)
ALT: 34 U/L (ref 17–63)
AST: 31 U/L (ref 15–41)
Alkaline Phosphatase: 62 U/L (ref 38–126)
Anion gap: 12 (ref 5–15)
BUN: 10 mg/dL (ref 6–20)
CO2: 26 mmol/L (ref 22–32)
CREATININE: 0.92 mg/dL (ref 0.61–1.24)
Calcium: 9.6 mg/dL (ref 8.9–10.3)
Chloride: 99 mmol/L — ABNORMAL LOW (ref 101–111)
GFR calc Af Amer: >60 mL/min (ref >60)
GFR calc non Af Amer: >60 mL/min (ref >60)
GLUCOSE: 105 mg/dL — AB (ref 65–99)
POTASSIUM: 4.3 mmol/L (ref 3.5–5.1)
Sodium: 137 mmol/L (ref 135–145)
Total Bilirubin: 1 mg/dL (ref 0.3–1.2)
Total Protein: 6.7 g/dL (ref 6.5–8.1)

## 2017-12-20 LAB — APTT: APTT: 30 s (ref 24–36)

## 2017-12-20 LAB — SURGICAL PCR SCREEN
MRSA, PCR: NEGATIVE
STAPHYLOCOCCUS AUREUS: NEGATIVE

## 2017-12-20 LAB — TYPE AND SCREEN
ABO/RH(D): A POS
ANTIBODY SCREEN: NEGATIVE

## 2017-12-20 NOTE — Progress Notes (Signed)
Mr Micheal Anderson denies chest pain or shortness of breath.  Patient does not see a cardiologist.  Patient reports that he had an EKG within the last year at his PCP, Dr Mena PaulsGoodyear, I have requested last office note and EKG, I also sent [facility] for his Dr to review.
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
## 2017-12-25 ENCOUNTER — Other Ambulatory Visit (INDEPENDENT_AMBULATORY_CARE_PROVIDER_SITE_OTHER): Payer: Self-pay

## 2017-12-25 MED ORDER — TRANEXAMIC ACID 1000 MG/10ML IV SOLN
2000.0000 mg | INTRAVENOUS | Status: AC
  Administered 2017-12-26: 2000 mg via TOPICAL
  Filled 2017-12-25: qty 20

## 2017-12-25 MED ORDER — VANCOMYCIN HCL 10 G IV SOLR
1500.0000 mg | INTRAVENOUS | Status: AC
  Administered 2017-12-26: 1500 mg via INTRAVENOUS
  Filled 2017-12-25: qty 1500

## 2017-12-25 MED ORDER — SODIUM CHLORIDE 0.9 % IV SOLN
1000.0000 mg | INTRAVENOUS | Status: AC
  Administered 2017-12-26: 1000 mg via INTRAVENOUS
  Filled 2017-12-25: qty 1100

## 2017-12-26 ENCOUNTER — Inpatient Hospital Stay (HOSPITAL_COMMUNITY): Payer: No Typology Code available for payment source

## 2017-12-26 ENCOUNTER — Inpatient Hospital Stay (HOSPITAL_COMMUNITY): Payer: No Typology Code available for payment source | Admitting: Anesthesiology

## 2017-12-26 ENCOUNTER — Encounter (HOSPITAL_COMMUNITY)
Admission: RE | Disposition: A | Discharge status: Home Health | Payer: Self-pay | Source: Ambulatory Visit | Attending: Orthopaedic Surgery

## 2017-12-26 ENCOUNTER — Other Ambulatory Visit: Payer: Self-pay

## 2017-12-26 ENCOUNTER — Inpatient Hospital Stay (HOSPITAL_COMMUNITY): Payer: No Typology Code available for payment source | Admitting: Emergency Medicine

## 2017-12-26 ENCOUNTER — Inpatient Hospital Stay (HOSPITAL_COMMUNITY)
Admission: RE | Admit: 2017-12-26 | Discharge: 2017-12-27 | DRG: 470 | Disposition: A | Discharge status: Home Health | Payer: No Typology Code available for payment source | Source: Ambulatory Visit | Attending: Orthopaedic Surgery | Admitting: Orthopaedic Surgery

## 2017-12-26 ENCOUNTER — Encounter (HOSPITAL_COMMUNITY): Payer: Self-pay

## 2017-12-26 DIAGNOSIS — Z79899 Other long term (current) drug therapy: Secondary | ICD-10-CM

## 2017-12-26 DIAGNOSIS — E669 Obesity, unspecified: Secondary | ICD-10-CM | POA: Diagnosis present

## 2017-12-26 DIAGNOSIS — I10 Essential (primary) hypertension: Secondary | ICD-10-CM | POA: Diagnosis present

## 2017-12-26 DIAGNOSIS — Z79891 Long term (current) use of opiate analgesic: Secondary | ICD-10-CM

## 2017-12-26 DIAGNOSIS — F419 Anxiety disorder, unspecified: Secondary | ICD-10-CM | POA: Diagnosis present

## 2017-12-26 DIAGNOSIS — M1611 Unilateral primary osteoarthritis, right hip: Principal | ICD-10-CM | POA: Diagnosis present

## 2017-12-26 DIAGNOSIS — Z96649 Presence of unspecified artificial hip joint: Secondary | ICD-10-CM

## 2017-12-26 DIAGNOSIS — Z87891 Personal history of nicotine dependence: Secondary | ICD-10-CM | POA: Diagnosis not present

## 2017-12-26 DIAGNOSIS — Z9889 Other specified postprocedural states: Secondary | ICD-10-CM

## 2017-12-26 DIAGNOSIS — Z6835 Body mass index (BMI) 35.0-35.9, adult: Secondary | ICD-10-CM

## 2017-12-26 DIAGNOSIS — Z880 Allergy status to penicillin: Secondary | ICD-10-CM

## 2017-12-26 DIAGNOSIS — Z96642 Presence of left artificial hip joint: Secondary | ICD-10-CM | POA: Diagnosis present

## 2017-12-26 DIAGNOSIS — K219 Gastro-esophageal reflux disease without esophagitis: Secondary | ICD-10-CM | POA: Diagnosis present

## 2017-12-26 DIAGNOSIS — Z419 Encounter for procedure for purposes other than remedying health state, unspecified: Secondary | ICD-10-CM

## 2017-12-26 HISTORY — PX: TOTAL HIP ARTHROPLASTY: SHX124

## 2017-12-26 HISTORY — DX: Unilateral primary osteoarthritis, unspecified hip: M16.10

## 2017-12-26 SURGERY — ARTHROPLASTY, HIP, TOTAL, ANTERIOR APPROACH
Anesthesia: Monitor Anesthesia Care | Site: Hip | Laterality: Right

## 2017-12-26 MED ORDER — SENNOSIDES-DOCUSATE SODIUM 8.6-50 MG PO TABS: 1.0000 | ORAL_TABLET | Freq: Every evening | ORAL | 1 refills | Status: AC | PRN

## 2017-12-26 MED ORDER — ASPIRIN 81 MG PO CHEW
81.0000 mg | CHEWABLE_TABLET | Freq: Two times a day (BID) | ORAL | Status: DC
  Administered 2017-12-27: 81 mg via ORAL
  Filled 2017-12-26: qty 1

## 2017-12-26 MED ORDER — OXYCODONE HCL 5 MG PO TABS: 5.0000 mg | ORAL_TABLET | Freq: Once | ORAL | Status: DC | PRN

## 2017-12-26 MED ORDER — DEXTROSE 5 % IV SOLN
INTRAVENOUS | Status: DC | PRN
  Administered 2017-12-26: 20 ug/min via INTRAVENOUS

## 2017-12-26 MED ORDER — PROMETHAZINE HCL 25 MG PO TABS: 25.0000 mg | ORAL_TABLET | Freq: Four times a day (QID) | ORAL | 1 refills | Status: AC | PRN

## 2017-12-26 MED ORDER — SODIUM CHLORIDE 0.9 % IV SOLN
1500.0000 mg | Freq: Two times a day (BID) | INTRAVENOUS | Status: DC
  Administered 2017-12-26: 1500 mg via INTRAVENOUS
  Filled 2017-12-26 (×2): qty 1500

## 2017-12-26 MED ORDER — FENTANYL CITRATE (PF) 250 MCG/5ML IJ SOLN
INTRAMUSCULAR | Status: AC
  Filled 2017-12-26: qty 5

## 2017-12-26 MED ORDER — METHOCARBAMOL 750 MG PO TABS: 750.0000 mg | ORAL_TABLET | Freq: Two times a day (BID) | ORAL | 0 refills | Status: AC | PRN

## 2017-12-26 MED ORDER — DEXAMETHASONE SODIUM PHOSPHATE 10 MG/ML IJ SOLN
10.0000 mg | Freq: Once | INTRAMUSCULAR | Status: AC
  Administered 2017-12-27: 10 mg via INTRAVENOUS
  Filled 2017-12-26: qty 1

## 2017-12-26 MED ORDER — FENTANYL CITRATE (PF) 100 MCG/2ML IJ SOLN
25.0000 ug | INTRAMUSCULAR | Status: DC | PRN
  Administered 2017-12-26 (×2): 25 ug via INTRAVENOUS

## 2017-12-26 MED ORDER — VANCOMYCIN HCL 1000 MG IV SOLR
INTRAVENOUS | Status: AC
  Filled 2017-12-26: qty 1000

## 2017-12-26 MED ORDER — 0.9 % SODIUM CHLORIDE (POUR BTL) OPTIME
TOPICAL | Status: DC | PRN
  Administered 2017-12-26: 1000 mL

## 2017-12-26 MED ORDER — MIDAZOLAM HCL 5 MG/5ML IJ SOLN
INTRAMUSCULAR | Status: DC | PRN
  Administered 2017-12-26: 2 mg via INTRAVENOUS

## 2017-12-26 MED ORDER — DIPHENHYDRAMINE HCL 12.5 MG/5ML PO ELIX: 25.0000 mg | ORAL_SOLUTION | ORAL | Status: DC | PRN

## 2017-12-26 MED ORDER — NAPHAZOLINE-GLYCERIN 0.012-0.2 % OP SOLN
Freq: Every day | OPHTHALMIC | Status: DC | PRN
  Administered 2017-12-26: 22:00:00 via OPHTHALMIC
  Administered 2017-12-27: 1 [drp] via OPHTHALMIC
  Filled 2017-12-26: qty 15

## 2017-12-26 MED ORDER — SAW PALMETTO 450 MG PO CAPS: 450.0000 mg | ORAL_CAPSULE | Freq: Two times a day (BID) | ORAL | Status: DC

## 2017-12-26 MED ORDER — OXYCODONE HCL ER 10 MG PO T12A: 10.0000 mg | EXTENDED_RELEASE_TABLET | Freq: Two times a day (BID) | ORAL | 0 refills | Status: AC

## 2017-12-26 MED ORDER — OXYCODONE HCL 5 MG/5ML PO SOLN: 5.0000 mg | Freq: Once | ORAL | Status: DC | PRN

## 2017-12-26 MED ORDER — POTASSIUM 99 MG PO TABS: 99.0000 mg | ORAL_TABLET | Freq: Every day | ORAL | Status: DC

## 2017-12-26 MED ORDER — ASPIRIN EC 81 MG PO TBEC
81.0000 mg | DELAYED_RELEASE_TABLET | Freq: Two times a day (BID) | ORAL | 0 refills | Status: AC
Start: 2017-12-26 — End: ?

## 2017-12-26 MED ORDER — PROPOFOL 500 MG/50ML IV EMUL
INTRAVENOUS | Status: DC | PRN
  Administered 2017-12-26: 75 ug/kg/min via INTRAVENOUS

## 2017-12-26 MED ORDER — SODIUM CHLORIDE 0.9 % IR SOLN
Status: DC | PRN
  Administered 2017-12-26: 3000 mL

## 2017-12-26 MED ORDER — HYDROCHLOROTHIAZIDE 25 MG PO TABS
25.0000 mg | ORAL_TABLET | Freq: Every day | ORAL | Status: DC
  Administered 2017-12-26 – 2017-12-27 (×2): 25 mg via ORAL
  Filled 2017-12-26 (×2): qty 1

## 2017-12-26 MED ORDER — SODIUM CHLORIDE 0.9 % IV SOLN
INTRAVENOUS | Status: DC
  Administered 2017-12-26: 18:00:00 via INTRAVENOUS

## 2017-12-26 MED ORDER — POLYETHYLENE GLYCOL 3350 17 G PO PACK: 17.0000 g | PACK | Freq: Every day | ORAL | Status: DC | PRN

## 2017-12-26 MED ORDER — LIDOCAINE 2% (20 MG/ML) 5 ML SYRINGE
INTRAMUSCULAR | Status: AC
  Filled 2017-12-26: qty 5

## 2017-12-26 MED ORDER — PHENOL 1.4 % MT LIQD: 1.0000 | OROMUCOSAL | Status: DC | PRN

## 2017-12-26 MED ORDER — SALINE SPRAY 0.65 % NA SOLN
1.0000 | NASAL | Status: DC | PRN
Start: 2017-12-26 — End: 2017-12-27
  Filled 2017-12-26: qty 44

## 2017-12-26 MED ORDER — OXYCODONE HCL 5 MG PO TABS: 5.0000 mg | ORAL_TABLET | ORAL | 0 refills | Status: DC | PRN

## 2017-12-26 MED ORDER — PROPOFOL 10 MG/ML IV BOLUS
INTRAVENOUS | Status: AC
  Filled 2017-12-26: qty 20

## 2017-12-26 MED ORDER — OXYCODONE HCL 5 MG PO TABS: 10.0000 mg | ORAL_TABLET | ORAL | Status: DC | PRN

## 2017-12-26 MED ORDER — ONDANSETRON HCL 4 MG/2ML IJ SOLN: 4.0000 mg | Freq: Once | INTRAMUSCULAR | Status: DC | PRN

## 2017-12-26 MED ORDER — METOCLOPRAMIDE HCL 5 MG PO TABS: 5.0000 mg | ORAL_TABLET | Freq: Three times a day (TID) | ORAL | Status: DC | PRN

## 2017-12-26 MED ORDER — ALUM & MAG HYDROXIDE-SIMETH 200-200-20 MG/5ML PO SUSP
30.0000 mL | ORAL | Status: DC | PRN
  Administered 2017-12-26: 30 mL via ORAL
  Filled 2017-12-26: qty 30

## 2017-12-26 MED ORDER — MIDAZOLAM HCL 2 MG/2ML IJ SOLN
INTRAMUSCULAR | Status: AC
  Filled 2017-12-26: qty 2

## 2017-12-26 MED ORDER — MENTHOL 3 MG MT LOZG: 1.0000 | LOZENGE | OROMUCOSAL | Status: DC | PRN

## 2017-12-26 MED ORDER — MAGNESIUM CITRATE PO SOLN: 1.0000 | Freq: Once | ORAL | Status: DC | PRN

## 2017-12-26 MED ORDER — SORBITOL 70 % SOLN: 30.0000 mL | Freq: Every day | Status: DC | PRN

## 2017-12-26 MED ORDER — PROPOFOL 10 MG/ML IV BOLUS
INTRAVENOUS | Status: DC | PRN
  Administered 2017-12-26: 10 mg via INTRAVENOUS
  Administered 2017-12-26 (×3): 20 mg via INTRAVENOUS

## 2017-12-26 MED ORDER — METHOCARBAMOL 1000 MG/10ML IJ SOLN
500.0000 mg | Freq: Four times a day (QID) | INTRAVENOUS | Status: DC | PRN
  Filled 2017-12-26: qty 5

## 2017-12-26 MED ORDER — VANCOMYCIN HCL 1000 MG IV SOLR
INTRAVENOUS | Status: DC | PRN
  Administered 2017-12-26: 1000 mg via TOPICAL

## 2017-12-26 MED ORDER — METOCLOPRAMIDE HCL 5 MG/ML IJ SOLN: 5.0000 mg | Freq: Three times a day (TID) | INTRAMUSCULAR | Status: DC | PRN

## 2017-12-26 MED ORDER — ONDANSETRON HCL 4 MG PO TABS: 4.0000 mg | ORAL_TABLET | Freq: Four times a day (QID) | ORAL | Status: DC | PRN

## 2017-12-26 MED ORDER — CHLORHEXIDINE GLUCONATE 4 % EX LIQD: 60.0000 mL | Freq: Once | CUTANEOUS | Status: DC

## 2017-12-26 MED ORDER — ACETAMINOPHEN 325 MG PO TABS: 325.0000 mg | ORAL_TABLET | Freq: Four times a day (QID) | ORAL | Status: DC | PRN

## 2017-12-26 MED ORDER — DEXAMETHASONE SODIUM PHOSPHATE 10 MG/ML IJ SOLN
INTRAMUSCULAR | Status: AC
  Filled 2017-12-26: qty 1

## 2017-12-26 MED ORDER — TRANEXAMIC ACID 1000 MG/10ML IV SOLN
2000.0000 mg | INTRAVENOUS | Status: AC
  Administered 2017-12-26: 2000 mg via TOPICAL
  Filled 2017-12-26: qty 20

## 2017-12-26 MED ORDER — FENTANYL CITRATE (PF) 100 MCG/2ML IJ SOLN
INTRAMUSCULAR | Status: DC | PRN
  Administered 2017-12-26 (×2): 50 ug via INTRAVENOUS

## 2017-12-26 MED ORDER — GABAPENTIN 300 MG PO CAPS
300.0000 mg | ORAL_CAPSULE | Freq: Three times a day (TID) | ORAL | Status: DC
  Administered 2017-12-26 – 2017-12-27 (×3): 300 mg via ORAL
  Filled 2017-12-26 (×3): qty 1

## 2017-12-26 MED ORDER — METHOCARBAMOL 500 MG PO TABS: 500.0000 mg | ORAL_TABLET | Freq: Four times a day (QID) | ORAL | Status: DC | PRN

## 2017-12-26 MED ORDER — LACTATED RINGERS IV SOLN
INTRAVENOUS | Status: DC
  Administered 2017-12-26 (×2): via INTRAVENOUS

## 2017-12-26 MED ORDER — OXYCODONE HCL 5 MG PO TABS
5.0000 mg | ORAL_TABLET | ORAL | Status: DC | PRN
  Administered 2017-12-26 – 2017-12-27 (×3): 10 mg via ORAL
  Filled 2017-12-26 (×3): qty 2

## 2017-12-26 MED ORDER — SACCHAROMYCES BOULARDII 250 MG PO CAPS
250.0000 mg | ORAL_CAPSULE | Freq: Two times a day (BID) | ORAL | Status: DC
  Administered 2017-12-26 – 2017-12-27 (×2): 250 mg via ORAL
  Filled 2017-12-26 (×2): qty 1

## 2017-12-26 MED ORDER — FENTANYL CITRATE (PF) 100 MCG/2ML IJ SOLN
INTRAMUSCULAR | Status: AC
  Filled 2017-12-26: qty 2

## 2017-12-26 MED ORDER — TRANEXAMIC ACID 1000 MG/10ML IV SOLN
1000.0000 mg | Freq: Once | INTRAVENOUS | Status: AC
  Administered 2017-12-26: 1000 mg via INTRAVENOUS
  Filled 2017-12-26: qty 10

## 2017-12-26 MED ORDER — ONDANSETRON HCL 4 MG/2ML IJ SOLN
INTRAMUSCULAR | Status: DC | PRN
  Administered 2017-12-26: 4 mg via INTRAVENOUS

## 2017-12-26 MED ORDER — HYDROMORPHONE HCL 2 MG/ML IJ SOLN: 0.5000 mg | INTRAMUSCULAR | Status: DC | PRN

## 2017-12-26 MED ORDER — ONDANSETRON HCL 4 MG PO TABS: 4.0000 mg | ORAL_TABLET | Freq: Three times a day (TID) | ORAL | 0 refills | Status: AC | PRN

## 2017-12-26 MED ORDER — ONDANSETRON HCL 4 MG/2ML IJ SOLN: 4.0000 mg | Freq: Four times a day (QID) | INTRAMUSCULAR | Status: DC | PRN

## 2017-12-26 MED ORDER — DEXAMETHASONE SODIUM PHOSPHATE 10 MG/ML IJ SOLN
INTRAMUSCULAR | Status: DC | PRN
  Administered 2017-12-26: 10 mg via INTRAVENOUS

## 2017-12-26 MED ORDER — ACETAMINOPHEN 500 MG PO TABS
1000.0000 mg | ORAL_TABLET | Freq: Four times a day (QID) | ORAL | Status: DC
  Administered 2017-12-26 – 2017-12-27 (×3): 1000 mg via ORAL
  Filled 2017-12-26 (×3): qty 2

## 2017-12-26 MED ORDER — BUSPIRONE HCL 5 MG PO TABS
7.5000 mg | ORAL_TABLET | Freq: Two times a day (BID) | ORAL | Status: DC
  Administered 2017-12-26 – 2017-12-27 (×2): 7.5 mg via ORAL
  Filled 2017-12-26 (×2): qty 2

## 2017-12-26 MED ORDER — OXYCODONE HCL ER 10 MG PO T12A
10.0000 mg | EXTENDED_RELEASE_TABLET | Freq: Two times a day (BID) | ORAL | Status: DC
  Administered 2017-12-26 – 2017-12-27 (×2): 10 mg via ORAL
  Filled 2017-12-26 (×2): qty 1

## 2017-12-26 MED ORDER — DOCUSATE SODIUM 100 MG PO CAPS
100.0000 mg | ORAL_CAPSULE | Freq: Two times a day (BID) | ORAL | Status: DC
  Administered 2017-12-26 – 2017-12-27 (×2): 100 mg via ORAL
  Filled 2017-12-26 (×2): qty 1

## 2017-12-26 MED ORDER — KETOROLAC TROMETHAMINE 15 MG/ML IJ SOLN
30.0000 mg | Freq: Four times a day (QID) | INTRAMUSCULAR | Status: DC
  Administered 2017-12-26 – 2017-12-27 (×3): 30 mg via INTRAVENOUS
  Filled 2017-12-26 (×3): qty 2

## 2017-12-26 MED ORDER — ONDANSETRON HCL 4 MG/2ML IJ SOLN
INTRAMUSCULAR | Status: AC
  Filled 2017-12-26: qty 2

## 2017-12-26 SURGICAL SUPPLY — 52 items
BAG DECANTER FOR FLEXI CONT (MISCELLANEOUS) ×6 IMPLANT
CAPT HIP TOTAL 2 ×3 IMPLANT
CELLS DAT CNTRL 66122 CELL SVR (MISCELLANEOUS) ×1 IMPLANT
COVER SURGICAL LIGHT HANDLE (MISCELLANEOUS) ×3 IMPLANT
DRAPE C-ARM 42X72 X-RAY (DRAPES) ×3 IMPLANT
DRAPE INCISE IOBAN 66X45 STRL (DRAPES) ×3 IMPLANT
DRAPE POUCH INSTRU U-SHP 10X18 (DRAPES) ×3 IMPLANT
DRAPE STERI IOBAN 125X83 (DRAPES) ×3 IMPLANT
DRAPE U-SHAPE 47X51 STRL (DRAPES) ×6 IMPLANT
DRSG AQUACEL AG ADV 3.5X10 (GAUZE/BANDAGES/DRESSINGS) ×3 IMPLANT
DURAPREP 26ML APPLICATOR (WOUND CARE) ×3 IMPLANT
ELECT BLADE 4.0 EZ CLEAN MEGAD (MISCELLANEOUS) ×3
ELECT REM PT RETURN 9FT ADLT (ELECTROSURGICAL) ×3
ELECTRODE BLDE 4.0 EZ CLN MEGD (MISCELLANEOUS) ×1 IMPLANT
ELECTRODE REM PT RTRN 9FT ADLT (ELECTROSURGICAL) ×1 IMPLANT
GAUZE XEROFORM 1X8 LF (GAUZE/BANDAGES/DRESSINGS) ×3 IMPLANT
GLOVE BIOGEL PI IND STRL 7.0 (GLOVE) ×1 IMPLANT
GLOVE BIOGEL PI INDICATOR 7.0 (GLOVE) ×2
GLOVE ECLIPSE 7.0 STRL STRAW (GLOVE) ×3 IMPLANT
GLOVE SKINSENSE NS SZ7.5 (GLOVE) ×2
GLOVE SKINSENSE STRL SZ7.5 (GLOVE) ×1 IMPLANT
GLOVE SURG SYN 7.5  E (GLOVE) ×4
GLOVE SURG SYN 7.5 E (GLOVE) ×2 IMPLANT
GOWN SRG XL XLNG 56XLVL 4 (GOWN DISPOSABLE) ×1 IMPLANT
GOWN STRL NON-REIN XL XLG LVL4 (GOWN DISPOSABLE) ×2
GOWN STRL REUS W/ TWL LRG LVL3 (GOWN DISPOSABLE) IMPLANT
GOWN STRL REUS W/TWL LRG LVL3 (GOWN DISPOSABLE)
HANDPIECE INTERPULSE COAX TIP (DISPOSABLE) ×2
HOOD PEEL AWAY FLYTE STAYCOOL (MISCELLANEOUS) ×6 IMPLANT
IV NS IRRIG 3000ML ARTHROMATIC (IV SOLUTION) ×3 IMPLANT
KIT BASIN OR (CUSTOM PROCEDURE TRAY) ×3 IMPLANT
MARKER SKIN DUAL TIP RULER LAB (MISCELLANEOUS) ×3 IMPLANT
NEEDLE SPNL 18GX3.5 QUINCKE PK (NEEDLE) ×3 IMPLANT
PACK TOTAL JOINT (CUSTOM PROCEDURE TRAY) ×3 IMPLANT
PACK UNIVERSAL I (CUSTOM PROCEDURE TRAY) ×3 IMPLANT
PENCIL BUTTON HOLSTER BLD 10FT (ELECTRODE) ×3 IMPLANT
RTRCTR WOUND ALEXIS 18CM MED (MISCELLANEOUS) ×3
SAW OSC TIP CART 19.5X105X1.3 (SAW) ×3 IMPLANT
SET HNDPC FAN SPRY TIP SCT (DISPOSABLE) ×1 IMPLANT
STAPLER VISISTAT 35W (STAPLE) IMPLANT
SUT ETHIBOND 2 V 37 (SUTURE) ×3 IMPLANT
SUT ETHILON 3 0 PS 1 (SUTURE) ×6 IMPLANT
SUT MON AB 2-0 CT1 36 (SUTURE) ×6 IMPLANT
SUT VIC AB 1 CT1 27 (SUTURE) ×2
SUT VIC AB 1 CT1 27XBRD ANBCTR (SUTURE) ×1 IMPLANT
SUT VIC AB 2-0 CT1 27 (SUTURE) ×6
SUT VIC AB 2-0 CT1 TAPERPNT 27 (SUTURE) ×3 IMPLANT
SYR 50ML LL SCALE MARK (SYRINGE) ×3 IMPLANT
TOWEL OR 17X26 10 PK STRL BLUE (TOWEL DISPOSABLE) ×3 IMPLANT
TRAY CATH 16FR W/PLASTIC CATH (SET/KITS/TRAYS/PACK) ×3 IMPLANT
TRAY URETHRAL FOLEY CATH 14FR (CATHETERS) ×3 IMPLANT
YANKAUER SUCT BULB TIP NO VENT (SUCTIONS) ×3 IMPLANT

## 2017-12-26 NOTE — Anesthesia Postprocedure Evaluation (Signed)
Anesthesia Post Note  Patient: Micheal Anderson  Procedure(s) Performed: RIGHT TOTAL HIP ARTHROPLASTY ANTERIOR APPROACH (Right Hip)     Patient location during evaluation: PACU Anesthesia Type: MAC Level of consciousness: oriented and awake and alert Pain management: pain level controlled Vital Signs Assessment: post-procedure vital signs reviewed and stable Respiratory status: spontaneous breathing, respiratory function stable and patient connected to nasal cannula oxygen Cardiovascular status: blood pressure returned to baseline and stable Postop Assessment: no headache, no backache and no apparent nausea or vomiting Anesthetic complications: no    Last Vitals:  Vitals:   12/26/17 1600 12/26/17 1605  BP:  109/87  Pulse: (!) 53 (!) 52  Resp: 14 12  Temp:    SpO2: 100% 100%    Last Pain:  Vitals:   12/26/17 1645  TempSrc:   PainSc: 2     LLE Motor Response: Purposeful movement (12/26/17 1645)   RLE Motor Response: Purposeful movement (12/26/17 1645)   L Sensory Level: S1-Sole of foot, small toes (12/26/17 1645) R Sensory Level: S1-Sole of foot, small toes (12/26/17 1645)  Delta Deshmukh COKER
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
## 2017-12-26 NOTE — Anesthesia Procedure Notes (Signed)
Procedure Name: MAC Date/Time: 12/26/2017 1:05 PM Performed by: Jenne Campus, CRNA Pre-anesthesia Checklist: Patient identified, Emergency Drugs available, Suction available and Patient being monitored Oxygen Delivery Method: Simple face mask

## 2017-12-26 NOTE — Anesthesia Preprocedure Evaluation (Addendum)
Anesthesia Evaluation  Patient identified by MRN, date of birth, ID band Patient awake    Reviewed: Allergy & Precautions, NPO status , Patient's Chart, lab work & pertinent test results  Airway Mallampati: III  TM Distance: >3 FB Neck ROM: Full    Dental  (+) Teeth Intact, Dental Advisory Given,    Pulmonary sleep apnea , former smoker,    breath sounds clear to auscultation       Cardiovascular hypertension, Pt. on medications  Rhythm:Regular Rate:Normal     Neuro/Psych    GI/Hepatic GERD  Medicated and Controlled,  Endo/Other    Renal/GU      Musculoskeletal   Abdominal (+) + obese,   Peds  Hematology   Anesthesia Other Findings   Reproductive/Obstetrics                            Anesthesia Physical Anesthesia Plan  ASA: III  Anesthesia Plan: Spinal and MAC   Post-op Pain Management:    Induction: Intravenous  PONV Risk Score and Plan: Ondansetron and Dexamethasone  Airway Management Planned: Nasal Cannula and Simple Face Mask  Additional Equipment:   Intra-op Plan:   Post-operative Plan:   Informed Consent: I have reviewed the patients History and Physical, chart, labs and discussed the procedure including the risks, benefits and alternatives for the proposed anesthesia with the patient or authorized representative who has indicated his/her understanding and acceptance.   Dental advisory given  Plan Discussed with: CRNA and Anesthesiologist  Anesthesia Plan Comments:         Anesthesia Quick Evaluation

## 2017-12-26 NOTE — Discharge Instructions (Signed)

## 2017-12-26 NOTE — Transfer of Care (Signed)
Immediate Anesthesia Transfer of Care Note  Patient: Micheal Anderson  Procedure(s) Performed: RIGHT TOTAL HIP ARTHROPLASTY ANTERIOR APPROACH (Right Hip)  Patient Location: PACU  Anesthesia Type:MAC and Spinal  Level of Consciousness: awake, oriented and patient cooperative  Airway & Oxygen Therapy: Patient Spontanous Breathing and Patient connected to face mask oxygen  Post-op Assessment: Report given to RN and Post -op Vital signs reviewed and stable  Post vital signs: Reviewed  Last Vitals:  Vitals Value Taken Time  BP 128/73 12/26/2017  3:50 PM  Temp    Pulse 72 12/26/2017  3:53 PM  Resp 13 12/26/2017  3:53 PM  SpO2 100 % 12/26/2017  3:53 PM  Vitals shown include unvalidated device data.  Last Pain:  Vitals:   12/26/17 1109  TempSrc: Oral  PainSc: 0-No pain      Patients Stated Pain Goal: 3 (21/30/86 5784)  Complications: No apparent anesthesia complications

## 2017-12-26 NOTE — Anesthesia Procedure Notes (Signed)
Spinal  Patient location during procedure: OR Start time: 12/26/2017 1:15 PM End time: 12/26/2017 1:20 PM Staffing Anesthesiologist: Kipp Brood, MD Performed: anesthesiologist  Preanesthetic Checklist Completed: patient identified, site marked, surgical consent, pre-op evaluation, timeout performed, IV checked, risks and benefits discussed and monitors and equipment checked Spinal Block Patient position: sitting Prep: ChloraPrep Patient monitoring: heart rate, cardiac monitor, continuous pulse ox and blood pressure Approach: midline Location: L3-4 Injection technique: single-shot Needle Needle type: Tuohy  Needle gauge: 22 G Assessment Sensory level: T6 Additional Notes 14 mg 0.75% Bupivacaine injected easily

## 2017-12-26 NOTE — Op Note (Signed)
RIGHT TOTAL HIP ARTHROPLASTY ANTERIOR APPROACH  Procedure Note Micheal Anderson   [id number]  Pre-op Diagnosis: osteoarthritis right hip     Post-op Diagnosis: same   Operative Procedures  1. Total hip replacement; Right hip; uncemented [id number]   Personnel  Surgeon(s): Tarry Kos, MD  Assist: Oneal Grout, PA-C; necessary for the timely completion of procedure and due to complexity of procedure.   Anesthesia: spinal  Prosthesis: Depuy Acetabulum: Pinnacle 58 mm Femur: Corail KA 16 Head: 36 mm size: +8.5 Liner: +4 neutral Bearing Type: ceramic on poly  Total Hip Arthroplasty (Anterior Approach) Op Note:  After informed consent was obtained and the operative extremity marked in the holding area, the patient was brought back to the operating room and placed supine on the HANA table. Next, the operative extremity was prepped and draped in normal sterile fashion. Surgical timeout occurred verifying patient identification, surgical site, surgical procedure and administration of antibiotics.  A modified anterior Smith-Peterson approach to the hip was performed, using the interval between tensor fascia lata and sartorius.  Dissection was carried bluntly down onto the anterior hip capsule. The lateral femoral circumflex vessels were identified and coagulated. A capsulotomy was performed and the capsular flaps tagged for later repair.  Fluoroscopy was utilized to prepare for the femoral neck cut. The neck osteotomy was performed. The femoral head was removed, the acetabular rim was cleared of soft tissue and attention was turned to reaming the acetabulum.  Sequential reaming was performed under fluoroscopic guidance. We reamed to a size 57 mm, and then impacted the acetabular shell. The liner was then placed after irrigation and attention turned to the femur.  After placing the femoral hook, the leg was taken to externally rotated, extended and adducted position taking care to  perform soft tissue releases to allow for adequate mobilization of the femur. Soft tissue was cleared from the shoulder of the greater trochanter and the hook elevator used to improve exposure of the proximal femur. Sequential broaching performed up to a size 16. I had to use thin shaft reamers in order to open the femoral canal to accommodate a 16 broach.  The 15 broach did not restore the proper hip mechanics and the hip joint was unstable.  Trial neck and head were placed. The leg was brought back up to neutral and the construct reduced. The position and sizing of components, offset and leg lengths were checked using fluoroscopy. Stability of the construct was checked in extension and external rotation without any subluxation or impingement of prosthesis. We dislocated the prosthesis, dropped the leg back into position, removed trial components, and irrigated copiously. The final stem and head was then placed, the leg brought back up, the system reduced and fluoroscopy used to verify positioning.  We irrigated, obtained hemostasis and closed the capsule using #2 ethibond suture.  One gram of vancomycin powder was placed in the surgical bed. The fascia was closed with #1 vicryl plus, the deep fat layer was closed with 0 vicryl, the subcutaneous layers closed with 2.0 Vicryl Plus and the skin closed with 3.0 monocryl and steri strips. A sterile dressing was applied. The patient was awakened in the operating room and taken to recovery in stable condition.  All sponge, needle, and instrument counts were correct at the end of the case.   Position: supine  Complications: none.  Time Out: performed   Drains/Packing: none  Estimated blood loss: 150 cc  Returned to Recovery Room: in good condition.  Antibiotics: yes   Mechanical VTE (DVT) Prophylaxis: sequential compression devices, TED thigh-high  Chemical VTE (DVT) Prophylaxis: aspirin   Fluid Replacement: see anesthesia record  Specimens Removed: 1  to pathology   Sponge and Instrument Count Correct? yes   PACU: portable radiograph - low AP   Admission: inpatient status  Plan/RTC: Return in 2 weeks for staple removal. Weight Bearing/Load Lower Extremity: full  Hip precautions: none Suture Removal: 10-14 days  Betadine to incision twice daily once dressing is removed on POD#7  N. Glee Arvin, MD [facility] [phone] 3:14 PM     Implant Name Type Inv. Item Serial No. Manufacturer Lot No. LRB No. Used  CUP SECTOR GRIPTON - [id number] Orthopedic Implant CUP SECTOR GRIPTON  DEPUY SYNTHES [id number] Right 1  LINER NEUTRAL 57X36MM PLUS4 - [id number]  LINER NEUTRAL 57X36MM PLUS4  DEPUY SYNTHES [id number] Right 1  STEM CORAIL KA16 - [id number] Stem STEM CORAIL KA16  DEPUY SYNTHES [id number] Right 1  HEAD CERAMIC 36 PLUS 8.5 12 14  - [id number] Hips HEAD CERAMIC 36 PLUS 8.5 12 14   DEPUY SYNTHES [id number] Right 1

## 2017-12-27 ENCOUNTER — Encounter (HOSPITAL_COMMUNITY): Payer: Self-pay | Admitting: Orthopaedic Surgery

## 2017-12-27 LAB — CBC
HEMATOCRIT: 43.4 % (ref 39.0–52.0)
Hemoglobin: 14.3 g/dL (ref 13.0–17.0)
MCH: 32.6 pg (ref 26.0–34.0)
MCHC: 32.9 g/dL (ref 30.0–36.0)
MCV: 98.9 fL (ref 78.0–100.0)
PLATELETS: 269 10*3/uL (ref 150–400)
RBC: 4.39 MIL/uL (ref 4.22–5.81)
RDW: 14.1 % (ref 11.5–15.5)
WBC: 10.5 10*3/uL (ref 4.0–10.5)

## 2017-12-27 LAB — BASIC METABOLIC PANEL
ANION GAP: 8 (ref 5–15)
BUN: 15 mg/dL (ref 6–20)
CALCIUM: 8.7 mg/dL — AB (ref 8.9–10.3)
CO2: 30 mmol/L (ref 22–32)
Chloride: 101 mmol/L (ref 101–111)
Creatinine, Ser: 1.01 mg/dL (ref 0.61–1.24)
GFR calc Af Amer: >60 mL/min (ref >60)
Glucose, Bld: 148 mg/dL — ABNORMAL HIGH (ref 65–99)
Potassium: 4.1 mmol/L (ref 3.5–5.1)
Sodium: 139 mmol/L (ref 135–145)

## 2017-12-27 NOTE — Progress Notes (Signed)
Reviewed dc instructions with prescriptions and follow up appointments. Pt verbalized understanding. Pt instructed to call MD or return for signs of infection or distress. Pt verbalized understanding.

## 2017-12-27 NOTE — Evaluation (Signed)
Physical Therapy Evaluation Patient Details Name: Micheal Anderson MRN: [id number] DOB: 1956/01/05 Today's Date: 12/27/2017   History of Present Illness  Pt is a 62 y/o male s/p R THA, direct anterior approach. PMH including but not limited to HTN and L THA in 2017.  Clinical Impression  Pt presented supine in bed with HOB elevated, awake and willing to participate in therapy session. Prior to admission, pt reported that he was independent with all functional mobility and ADLs. Pt currently able to perform bed mobility with min A for R LE movement, transfers with min guard for safety and ambulation in hallway with RW and min guard. Pt successfully completed stair training this session as well with no concerns. Plan is for pt to d/c home today and is cleared to d/c from a PT perspective. PT will continue to follow acutely to progress mobility as tolerated.   Pt would continue to benefit from skilled physical therapy services at this time while admitted and after d/c to address the below listed limitations in order to improve overall safety and independence with functional mobility.     Follow Up Recommendations Home health PT;Supervision - Intermittent    Equipment Recommendations  None recommended by PT    Recommendations for Other Services       Precautions / Restrictions Precautions Precautions: Fall Restrictions Weight Bearing Restrictions: Yes RLE Weight Bearing: Weight bearing as tolerated      Mobility  Bed Mobility Overal bed mobility: Needs Assistance Bed Mobility: Supine to Sit     Supine to sit: Min assist     General bed mobility comments: increased time and effort, assistance with R LE movement  Transfers Overall transfer level: Needs assistance Equipment used: Rolling walker (2 wheeled) Transfers: Sit to/from Stand Sit to Stand: Min guard         General transfer comment: cueing for safe hand placement and technique, min guard for  safety  Ambulation/Gait Ambulation/Gait assistance: Min guard Ambulation Distance (Feet): 200 Feet Assistive device: Rolling walker (2 wheeled) Gait Pattern/deviations: Step-through pattern;Decreased stride length Gait velocity: decreased Gait velocity interpretation: <1.31 ft/sec, indicative of household ambulator General Gait Details: no instability or LOB, good technique/sequencing wtih RW, occasional cueing for improved upright posture  Stairs Stairs: Yes Stairs assistance: Min guard Stair Management: Two rails;Step to pattern;Forwards Number of Stairs: 2 General stair comments: cueing for sequencing/technique; pt ascended with L LE leading and descended with R LE leading  Wheelchair Mobility    Modified Rankin (Stroke Patients Only)       Balance Overall balance assessment: Needs assistance Sitting-balance support: Feet supported Sitting balance-Leahy Scale: Good     Standing balance support: During functional activity;No upper extremity supported Standing balance-Leahy Scale: Fair                               Pertinent Vitals/Pain Pain Assessment: 0-10 Pain Score: 5  Pain Location: lateral R hip Pain Descriptors / Indicators: Sore Pain Intervention(s): Monitored during session;Repositioned    Home Living Family/patient expects to be discharged to:: Private residence Living Arrangements: Spouse/significant other;Children Available Help at Discharge: Family;Available 24 hours/day Type of Home: House Home Access: Stairs to enter Entrance Stairs-Rails: Doctor, general practice of Steps: 9 Home Layout: One level Home Equipment: Walker - 2 wheels;Bedside commode;Cane - quad      Prior Function Level of Independence: Independent with assistive device(s)         Comments: pt had  been using a SPC for ~1 week     Hand Dominance        Extremity/Trunk Assessment   Upper Extremity Assessment Upper Extremity Assessment: Overall WFL  for tasks assessed    Lower Extremity Assessment Lower Extremity Assessment: Overall WFL for tasks assessed       Communication   Communication: No difficulties  Cognition Arousal/Alertness: Awake/alert Behavior During Therapy: WFL for tasks assessed/performed Overall Cognitive Status: Within Functional Limits for tasks assessed                                        General Comments      Exercises     Assessment/Plan    PT Assessment Patient needs continued PT services  PT Problem List Decreased balance;Decreased mobility;Decreased coordination;Pain       PT Treatment Interventions DME instruction;Gait training;Stair training;Functional mobility training;Therapeutic activities;Therapeutic exercise;Balance training;Neuromuscular re-education;Patient/family education    PT Goals (Current goals can be found in the Care Plan section)  Acute Rehab PT Goals Patient Stated Goal: return home today PT Goal Formulation: With patient Time For Goal Achievement: 01/10/18 Potential to Achieve Goals: Good    Frequency 7X/week   Barriers to discharge        Co-evaluation               AM-PAC PT "6 Clicks" Daily Activity  Outcome Measure Difficulty turning over in bed (including adjusting bedclothes, sheets and blankets)?: Unable Difficulty moving from lying on back to sitting on the side of the bed? : Unable Difficulty sitting down on and standing up from a chair with arms (e.g., wheelchair, bedside commode, etc,.)?: Unable Help needed moving to and from a bed to chair (including a wheelchair)?: None Help needed walking in hospital room?: None Help needed climbing 3-5 steps with a railing? : A Little 6 Click Score: 14    End of Session Equipment Utilized During Treatment: Gait belt Activity Tolerance: Patient tolerated treatment well Patient left: in chair;with call bell/phone within reach Nurse Communication: Mobility status PT Visit Diagnosis:  Other abnormalities of gait and mobility (R26.89);Pain Pain - Right/Left: Right Pain - part of body: Hip    Time: 4132-4401 PT Time Calculation (min) (ACUTE ONLY): 31 min   Charges:   PT Evaluation $PT Eval Low Complexity: 1 Low PT Treatments $Gait Training: 8-22 mins   PT G Codes:        Micheal Anderson, PT, DPT [phone]   Micheal Anderson 12/27/2017, 12:02 PM

## 2017-12-27 NOTE — Progress Notes (Signed)
Subjective: 1 Day Post-Op Procedure(s) (LRB): RIGHT TOTAL HIP ARTHROPLASTY ANTERIOR APPROACH (Right) Patient reports pain as mild.  Doing ok this am.    Objective: Vital signs in last 24 hours: Temp:  [97.5 F (36.4 C)-98.4 F (36.9 C)] 97.5 F (36.4 C) (05/03 0425) Pulse Rate:  [47-79] 53 (05/03 0425) Resp:  [8-20] 16 (05/03 0425) BP: (109-158)/(62-87) 130/71 (05/03 0425) SpO2:  [98 %-100 %] 99 % (05/03 0425) Weight:  [248 lb (112.5 kg)] 248 lb (112.5 kg) (05/02 1109)  Intake/Output from previous day: 05/02 0701 - 05/03 0700 In: 3060 [P.O.:360; I.V.:2200; IV Piggyback:500] Out: 1050 [Urine:850; Blood:200] Intake/Output this shift: No intake/output data recorded.  Recent Labs    12/27/17 0502  HGB 14.3   Recent Labs    12/27/17 0502  WBC 10.5  RBC 4.39  HCT 43.4  PLT 269   Recent Labs    12/27/17 0502  NA 139  K 4.1  CL 101  CO2 30  BUN 15  CREATININE 1.01  GLUCOSE 148*  CALCIUM 8.7*   No results for input(s): LABPT, INR in the last 72 hours.  Neurologically intact Neurovascular intact Sensation intact distally Intact pulses distally Dorsiflexion/Plantar flexion intact Incision: dressing C/D/I No cellulitis present Compartment soft    Assessment/Plan: 1 Day Post-Op Procedure(s) (LRB): RIGHT TOTAL HIP ARTHROPLASTY ANTERIOR APPROACH (Right) Advance diet Up with therapy D/C IV fluids Discharge home with home health after second session of PT as long as he mobilizes well.  Otherwise, will d/c tomorrow WBAT RLE-NO precautions    Cristie Hem 12/27/2017, 7:49 AM

## 2017-12-27 NOTE — Care Management Note (Signed)
Case Management Note  Patient Details  Name: Micheal Anderson MRN: [id number] Date of Birth: 09-Nov-1955  Subjective/Objective:  62 yr old gentleman s/p right total hip arthroplasty.                  Action/Plan: Patient was preoperatively setup with [facility], no changes. He has DME from previous surgery. Patient will have support at discharge.   Expected Discharge Date:  12/27/17               Expected Discharge Plan:  Home w Home Health Services  In-House Referral:  NA  Discharge planning Services  CM Consult  Post Acute Care Choice:  Home Health Choice offered to:  Patient  DME Arranged:  N/A(has RW and 3in1) DME Agency:  NA  HH Arranged:  PT HH Agency:  [facility] (formerly [facility])  Status of Service:  Completed, signed off  If discussed at [employer], dates discussed:    Additional Comments:  Durenda Guthrie, RN 12/27/2017, 1:50 PM
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
## 2017-12-30 ENCOUNTER — Telehealth (INDEPENDENT_AMBULATORY_CARE_PROVIDER_SITE_OTHER): Payer: Self-pay | Admitting: Orthopaedic Surgery

## 2017-12-30 NOTE — Telephone Encounter (Signed)
Byrd Hesselbach -PT with [facility] called needing verbal orders for 3 wk 2. The number to contact Byrd Hesselbach is [phone]

## 2017-12-30 NOTE — Telephone Encounter (Signed)
Patient called saying that on his surgical bandage there is a few drops of what look like possibly blood or fluid and he was wondering if this was normal? CB # [phone]

## 2017-12-30 NOTE — Telephone Encounter (Signed)
I called and spoke with patient. I advised it is normal to see some blood on the bandage post operatively. Did explain that we would want him to call us back if he noticed increased drainage with bright blood or fluid. Otherwise, leave the bandage on until follow up appt on 01/09/18. Patient expressed understanding.

## 2017-12-30 NOTE — Telephone Encounter (Signed)
I gave orders to Maria 

## 2018-01-01 ENCOUNTER — Telehealth (INDEPENDENT_AMBULATORY_CARE_PROVIDER_SITE_OTHER): Payer: Self-pay | Admitting: Orthopaedic Surgery

## 2018-01-01 NOTE — Telephone Encounter (Signed)
Patient called asking for a refill on oxycodone. CB # [phone]

## 2018-01-02 ENCOUNTER — Other Ambulatory Visit (INDEPENDENT_AMBULATORY_CARE_PROVIDER_SITE_OTHER): Payer: Self-pay

## 2018-01-02 ENCOUNTER — Telehealth (INDEPENDENT_AMBULATORY_CARE_PROVIDER_SITE_OTHER): Payer: Self-pay

## 2018-01-02 MED ORDER — OXYCODONE HCL 5 MG PO TABS: ORAL_TABLET | ORAL | 0 refills | Status: DC

## 2018-01-02 NOTE — Telephone Encounter (Signed)
Rx printed

## 2018-01-02 NOTE — Telephone Encounter (Signed)
#  30.  1-2 tab daily prn

## 2018-01-02 NOTE — Telephone Encounter (Signed)
Rx for Oxycodone needed a Prior Auth. Called [employer] and was approved for 1 month.   Called Patient. He is aware.

## 2018-01-02 NOTE — Telephone Encounter (Signed)
Ready for pick up

## 2018-01-02 NOTE — Telephone Encounter (Signed)
Please advise 

## 2018-01-02 NOTE — Telephone Encounter (Signed)
Patient called to check on rx, per Marisue Ivan I told patient it was ready for pick up

## 2018-01-09 ENCOUNTER — Encounter (INDEPENDENT_AMBULATORY_CARE_PROVIDER_SITE_OTHER): Payer: Self-pay | Admitting: Orthopaedic Surgery

## 2018-01-09 ENCOUNTER — Ambulatory Visit (INDEPENDENT_AMBULATORY_CARE_PROVIDER_SITE_OTHER): Payer: 59 | Admitting: Orthopaedic Surgery

## 2018-01-09 DIAGNOSIS — M1611 Unilateral primary osteoarthritis, right hip: Secondary | ICD-10-CM
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
## 2018-01-13 ENCOUNTER — Other Ambulatory Visit (INDEPENDENT_AMBULATORY_CARE_PROVIDER_SITE_OTHER): Payer: Self-pay

## 2018-01-13 ENCOUNTER — Telehealth (INDEPENDENT_AMBULATORY_CARE_PROVIDER_SITE_OTHER): Payer: Self-pay | Admitting: Orthopaedic Surgery

## 2018-01-13 MED ORDER — OXYCODONE-ACETAMINOPHEN 5-325 MG PO TABS: ORAL_TABLET | ORAL | 0 refills | Status: DC

## 2018-01-13 NOTE — Telephone Encounter (Signed)
Yes #20. 1-2 tab daily prn

## 2018-01-13 NOTE — Telephone Encounter (Signed)
Patient left vm requesting RX refill on oxycodone. Patients # [phone]

## 2018-01-13 NOTE — Telephone Encounter (Signed)
Rx printed. Pending signature. Will be ready for pick up tomorrow AM.

## 2018-01-13 NOTE — Telephone Encounter (Signed)
Please advise 

## 2018-01-21 ENCOUNTER — Telehealth (INDEPENDENT_AMBULATORY_CARE_PROVIDER_SITE_OTHER): Payer: Self-pay

## 2018-01-21 NOTE — Telephone Encounter (Signed)
Patient called stating that his right hip has a knot under his incision.  Stated no redness, no drainage, and not hot to the touch. Patient has been using ice packs and stated that it does help.  Would like to know if this normal?  Cb# is [phone].  Please advise.  Thank you.

## 2018-01-21 NOTE — Telephone Encounter (Signed)
It's either a little swelling or a suture knot.  Just keep an eye on it.  Thanks.

## 2018-01-22 NOTE — Telephone Encounter (Signed)
Patient advised.  He said he put some ice on the area last night and the knot was not as big this morning.  He will let us know if it develops any redness, warmth, or drainage.

## 2018-02-04 ENCOUNTER — Other Ambulatory Visit (INDEPENDENT_AMBULATORY_CARE_PROVIDER_SITE_OTHER): Payer: Self-pay | Admitting: Orthopaedic Surgery

## 2018-02-04 NOTE — Telephone Encounter (Signed)
Dont need it anymore.

## 2018-02-11 ENCOUNTER — Encounter (INDEPENDENT_AMBULATORY_CARE_PROVIDER_SITE_OTHER): Payer: Self-pay | Admitting: Orthopaedic Surgery

## 2018-02-11 ENCOUNTER — Ambulatory Visit (INDEPENDENT_AMBULATORY_CARE_PROVIDER_SITE_OTHER): Payer: 59

## 2018-02-11 ENCOUNTER — Ambulatory Visit (INDEPENDENT_AMBULATORY_CARE_PROVIDER_SITE_OTHER): Payer: 59 | Admitting: Orthopaedic Surgery

## 2018-02-11 DIAGNOSIS — M1611 Unilateral primary osteoarthritis, right hip: Secondary | ICD-10-CM | POA: Diagnosis not present
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
## 2018-02-26 ENCOUNTER — Encounter: Payer: Self-pay | Admitting: Gastroenterology

## 2018-02-26 ENCOUNTER — Other Ambulatory Visit (INDEPENDENT_AMBULATORY_CARE_PROVIDER_SITE_OTHER): Payer: Self-pay

## 2018-02-26 ENCOUNTER — Telehealth (INDEPENDENT_AMBULATORY_CARE_PROVIDER_SITE_OTHER): Payer: Self-pay | Admitting: Orthopaedic Surgery

## 2018-02-26 MED ORDER — AMOXICILLIN 500 MG PO TABS: ORAL_TABLET | ORAL | 0 refills | Status: AC

## 2018-02-26 NOTE — Telephone Encounter (Signed)
Patient asking question about needing an antibiotic. Hes saying he has a colonoscopy scheduled for 7/18. CB # [phone]

## 2018-02-26 NOTE — Telephone Encounter (Signed)
2 g amoxicillin 30 mins before procedure

## 2018-02-26 NOTE — Telephone Encounter (Signed)
SENT INTO PHARM 

## 2018-02-26 NOTE — Telephone Encounter (Signed)
Please advise 

## 2018-03-04 ENCOUNTER — Ambulatory Visit (AMBULATORY_SURGERY_CENTER): Payer: Self-pay

## 2018-03-04 VITALS — Ht 70.0 in | Wt 236.8 lb

## 2018-03-04 DIAGNOSIS — R195 Other fecal abnormalities: Secondary | ICD-10-CM

## 2018-03-04 MED ORDER — SOD PICOSULFATE-MAG OX-CIT ACD 10-3.5-12 MG-GM -GM/160ML PO SOLN: 1.0000 | Freq: Once | ORAL | Status: AC

## 2018-03-04 NOTE — Progress Notes (Signed)
Per pt, no allergies to soy or egg products.Pt not taking any weight loss meds or using  O2 at home.  Pt refused emmi video. 

## 2018-03-06 NOTE — Telephone Encounter (Signed)
Please advise 

## 2018-03-06 NOTE — Telephone Encounter (Signed)
Patient has a medication problem with this antibiotic. [place] is telling patient that he cannot even have a sip of water after 11 a.m. since his procedure is at 2 p.m. So the directions for 30 min prior to procedure will not work, he is wondering if he can take it at 3611 and it still work? Procedure scheduled on 7/18. Please advise # [phone]

## 2018-03-06 NOTE — Telephone Encounter (Signed)
Ok then he can take the abx just before 11 am then

## 2018-03-07 NOTE — Telephone Encounter (Signed)
Left message on patient's cell phone:ok to take the abx just before 11 on the day of the procedure.

## 2018-03-13 ENCOUNTER — Other Ambulatory Visit: Payer: Self-pay

## 2018-03-13 ENCOUNTER — Encounter: Payer: Self-pay | Admitting: Gastroenterology

## 2018-03-13 ENCOUNTER — Ambulatory Visit (AMBULATORY_SURGERY_CENTER): Payer: 59 | Admitting: Gastroenterology

## 2018-03-13 ENCOUNTER — Encounter: Payer: 59 | Admitting: Gastroenterology

## 2018-03-13 VITALS — BP 118/65 | HR 58 | Temp 98.7°F | Resp 16 | Ht 70.0 in | Wt 236.0 lb

## 2018-03-13 DIAGNOSIS — R195 Other fecal abnormalities: Secondary | ICD-10-CM | POA: Diagnosis present

## 2018-03-13 MED ORDER — SODIUM CHLORIDE 0.9 % IV SOLN
500.0000 mL | Freq: Once | INTRAVENOUS | Status: AC
Start: 2018-03-13 — End: ?

## 2018-03-13 NOTE — Progress Notes (Signed)
Pt's states no medical or surgical changes since previsit or office visit. 

## 2018-03-13 NOTE — Op Note (Signed)
[facility] Patient Name: Micheal Anderson Procedure Date: 03/13/2018 2:16 PM MRN: [id number] Endoscopist: Jackquline Denmark , MD Age: 62 Referring MD:  Date of Birth: 06-May-1956 Gender: Male Account #: [id number] Procedure:                Colonoscopy Indications:              Screening for colorectal malignant neoplasm Medicines:                Monitored Anesthesia Care Procedure:                Pre-Anesthesia Assessment:                           - Prior to the procedure, a History and Physical                            was performed, and patient medications and                            allergies were reviewed. The patient is competent.                            The risks and benefits of the procedure and the                            sedation options and risks were discussed with the                            patient. All questions were answered and informed                            consent was obtained. Patient identification and                            proposed procedure were verified by the physician                            in the procedure room. Mental Status Examination:                            alert and oriented. Prophylactic Antibiotics: The                            patient does not require prophylactic antibiotics.                            Prior Anticoagulants: The patient has taken no                            previous anticoagulant or antiplatelet agents. ASA                            Grade Assessment: II - A patient with mild systemic  disease. After reviewing the risks and benefits,                            the patient was deemed in satisfactory condition to                            undergo the procedure. The anesthesia plan was to                            use monitored anesthesia care (MAC). Immediately                            prior to administration of medications, the patient                            was  re-assessed for adequacy to receive sedatives.                            The heart rate, respiratory rate, oxygen                            saturations, blood pressure, adequacy of pulmonary                            ventilation, and response to care were monitored                            throughout the procedure. The physical status of                            the patient was re-assessed after the procedure.                           After obtaining informed consent, the colonoscope                            was passed under direct vision. Throughout the                            procedure, the patient's blood pressure, pulse, and                            oxygen saturations were monitored continuously. The                            Colonoscope was introduced through the anus and                            advanced to the the cecum, identified by                            appendiceal orifice and ileocecal valve. The  colonoscopy was performed without difficulty. The                            patient tolerated the procedure well. The quality                            of the bowel preparation was good. Scope In: 2:22:41 PM Scope Out: 2:42:40 PM Scope Withdrawal Time: 0 hours 13 minutes 26 seconds  Total Procedure Duration: 0 hours 19 minutes 59 seconds  Findings:                 Multiple small-mouthed diverticula were found in                            the sigmoid colon and descending colon.                           Non-bleeding internal hemorrhoids were found during                            retroflexion. The hemorrhoids were small.                           The exam was otherwise without abnormality. Complications:            No immediate complications. Estimated Blood Loss:     Estimated blood loss: none. Impression:               - Moderate left colonic diverticulosis.                           - Small internal hemorrhoids. Otherwise normal                             colonoscopy. Recommendation:           - Patient has a contact number available for                            emergencies. The signs and symptoms of potential                            delayed complications were discussed with the                            patient. Return to normal activities tomorrow.                            Written discharge instructions were provided to the                            patient.                           - High fiber diet.                           - Continue present  medications.                           - Repeat colonoscopy in 10 years for screening                            purposes.                           - Return to GI clinic PRN. Jackquline Denmark, MD 03/13/2018 2:48:04 PM This report has been signed electronically.

## 2018-03-13 NOTE — Patient Instructions (Signed)
Please read handouts on diverticulosis, high fiber diet, hemorrhoids. Return to GI clinic as needed. Continue present medications.     YOU HAD AN ENDOSCOPIC PROCEDURE TODAY AT THE [facility]:   Refer to the procedure report that was given to you for any specific questions about what was found during the examination.  If the procedure report does not answer your questions, please call your gastroenterologist to clarify.  If you requested that your care partner not be given the details of your procedure findings, then the procedure report has been included in a sealed envelope for you to review at your convenience later.  YOU SHOULD EXPECT: Some feelings of bloating in the abdomen. Passage of more gas than usual.  Walking can help get rid of the air that was put into your GI tract during the procedure and reduce the bloating. If you had a lower endoscopy (such as a colonoscopy or flexible sigmoidoscopy) you may notice spotting of blood in your stool or on the toilet paper. If you underwent a bowel prep for your procedure, you may not have a normal bowel movement for a few days.  Please Note:  You might notice some irritation and congestion in your nose or some drainage.  This is from the oxygen used during your procedure.  There is no need for concern and it should clear up in a day or so.  SYMPTOMS TO REPORT IMMEDIATELY:   Following lower endoscopy (colonoscopy or flexible sigmoidoscopy):  Excessive amounts of blood in the stool  Significant tenderness or worsening of abdominal pains  Swelling of the abdomen that is new, acute  Fever of 100F or higher    For urgent or emergent issues, a gastroenterologist can be reached at any hour by calling [phone].   DIET:  We do recommend a small meal at first, but then you may proceed to your regular diet.  Drink plenty of fluids but you should avoid alcoholic beverages for 24 hours.  ACTIVITY:  You should plan to take it easy  for the rest of today and you should NOT DRIVE or use heavy machinery until tomorrow (because of the sedation medicines used during the test).    FOLLOW UP: Our staff will call the number listed on your records the next business day following your procedure to check on you and address any questions or concerns that you may have regarding the information given to you following your procedure. If we do not reach you, we will leave a message.  However, if you are feeling well and you are not experiencing any problems, there is no need to return our call.  We will assume that you have returned to your regular daily activities without incident.  If any biopsies were taken you will be contacted by phone or by letter within the next 1-3 weeks.  Please call us at [phone] if you have not heard about the biopsies in 3 weeks.    SIGNATURES/CONFIDENTIALITY: You and/or your care partner have signed paperwork which will be entered into your electronic medical record.  These signatures attest to the fact that that the information above on your After Visit Summary has been reviewed and is understood.  Full responsibility of the confidentiality of this discharge information lies with you and/or your care-partner.

## 2018-03-17 ENCOUNTER — Telehealth: Payer: Self-pay | Admitting: *Deleted

## 2018-03-17 ENCOUNTER — Telehealth: Payer: Self-pay

## 2018-03-17 NOTE — Telephone Encounter (Signed)
Follow-up call attempt #1.  Answering machine with no identifier.  No message left.

## 2018-03-17 NOTE — Telephone Encounter (Signed)
  Follow up Call-  Call back number 03/13/2018  Post procedure Call Back phone  # [phone]  Permission to leave phone message Yes  Some recent data might be hidden     Patient questions:  Do you have a fever, pain , or abdominal swelling? No. Pain Score  0 *  Have you tolerated food without any problems? Yes.    Have you been able to return to your normal activities? Yes.    Do you have any questions about your discharge instructions: Diet   No. Medications  No. Follow up visit  No.  Do you have questions or concerns about your Care? No.  Actions: * If pain score is 4 or above: No action needed, pain <4.

## 2018-03-25 ENCOUNTER — Ambulatory Visit (INDEPENDENT_AMBULATORY_CARE_PROVIDER_SITE_OTHER): Payer: 59 | Admitting: Orthopaedic Surgery

## 2018-03-25 DIAGNOSIS — M1611 Unilateral primary osteoarthritis, right hip: Secondary | ICD-10-CM

## 2018-03-25 NOTE — Progress Notes (Signed)
3 month THA follow up plan  Patient now 3 months status post total hip arthroplasty. Wound is healed with no signs of complications or infection.  The patient does not complain of pain, and is back to normal daily activities. Radiographs reveal a total hip arthroplasty in good position, with no evidence of subsidence, loosening, or complicating features. It was reinforced that prophylactic antibiotics should be taken with any procedure including but not limited to dental work or colonoscopies.  We will plan on following up at the 6 month postop visit with radiographs at that time. As always, instructions were given to call with any questions or concerns in the interim.  Had colonoscopy last week and did well.  No complaints

## 2018-06-25 ENCOUNTER — Ambulatory Visit (INDEPENDENT_AMBULATORY_CARE_PROVIDER_SITE_OTHER): Payer: 59 | Admitting: Physician Assistant

## 2018-06-25 ENCOUNTER — Ambulatory Visit (INDEPENDENT_AMBULATORY_CARE_PROVIDER_SITE_OTHER): Payer: 59 | Admitting: Orthopaedic Surgery

## 2018-07-04 ENCOUNTER — Ambulatory Visit (INDEPENDENT_AMBULATORY_CARE_PROVIDER_SITE_OTHER): Payer: 59

## 2018-07-04 ENCOUNTER — Encounter (INDEPENDENT_AMBULATORY_CARE_PROVIDER_SITE_OTHER): Payer: Self-pay | Admitting: Physician Assistant

## 2018-07-04 ENCOUNTER — Ambulatory Visit (INDEPENDENT_AMBULATORY_CARE_PROVIDER_SITE_OTHER): Payer: 59 | Admitting: Orthopaedic Surgery

## 2018-07-04 DIAGNOSIS — Z96641 Presence of right artificial hip joint: Secondary | ICD-10-CM
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
## 2019-01-01 ENCOUNTER — Telehealth: Payer: Self-pay

## 2019-01-01 NOTE — Telephone Encounter (Signed)
Called and left a VM for patient to CB to confirm appointment for Friday, 01/02/2019. 

## 2019-01-02 ENCOUNTER — Ambulatory Visit (INDEPENDENT_AMBULATORY_CARE_PROVIDER_SITE_OTHER): Payer: Medicare HMO | Admitting: Orthopaedic Surgery

## 2019-01-02 ENCOUNTER — Encounter: Payer: Self-pay | Admitting: Orthopaedic Surgery

## 2019-01-02 ENCOUNTER — Other Ambulatory Visit: Payer: Self-pay

## 2019-01-02 ENCOUNTER — Ambulatory Visit (INDEPENDENT_AMBULATORY_CARE_PROVIDER_SITE_OTHER): Payer: Medicare HMO

## 2019-01-02 DIAGNOSIS — Z09 Encounter for follow-up examination after completed treatment for conditions other than malignant neoplasm: Secondary | ICD-10-CM

## 2019-01-02 DIAGNOSIS — Z96641 Presence of right artificial hip joint: Secondary | ICD-10-CM

## 2019-01-02 DIAGNOSIS — Z96643 Presence of artificial hip joint, bilateral: Secondary | ICD-10-CM | POA: Diagnosis not present

## 2019-01-02 DIAGNOSIS — Z96642 Presence of left artificial hip joint: Secondary | ICD-10-CM
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
## 2020-01-05 ENCOUNTER — Other Ambulatory Visit: Payer: Self-pay

## 2020-01-05 ENCOUNTER — Encounter: Payer: Self-pay | Admitting: Orthopaedic Surgery

## 2020-01-05 ENCOUNTER — Ambulatory Visit: Payer: Medicare HMO | Admitting: Orthopaedic Surgery

## 2020-01-05 ENCOUNTER — Ambulatory Visit (INDEPENDENT_AMBULATORY_CARE_PROVIDER_SITE_OTHER): Payer: Medicare HMO

## 2020-01-05 DIAGNOSIS — Z96643 Presence of artificial hip joint, bilateral: Secondary | ICD-10-CM
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
## 2020-05-01 IMAGING — DX DG PORTABLE PELVIS
1 series · 1 of 1 positions shown · non-contrast
Comparison: 10/08/2016

CLINICAL DATA: Status post right hip arthroplasty

EXAM:
PORTABLE PELVIS 1-2 VIEWS

[pelvis]
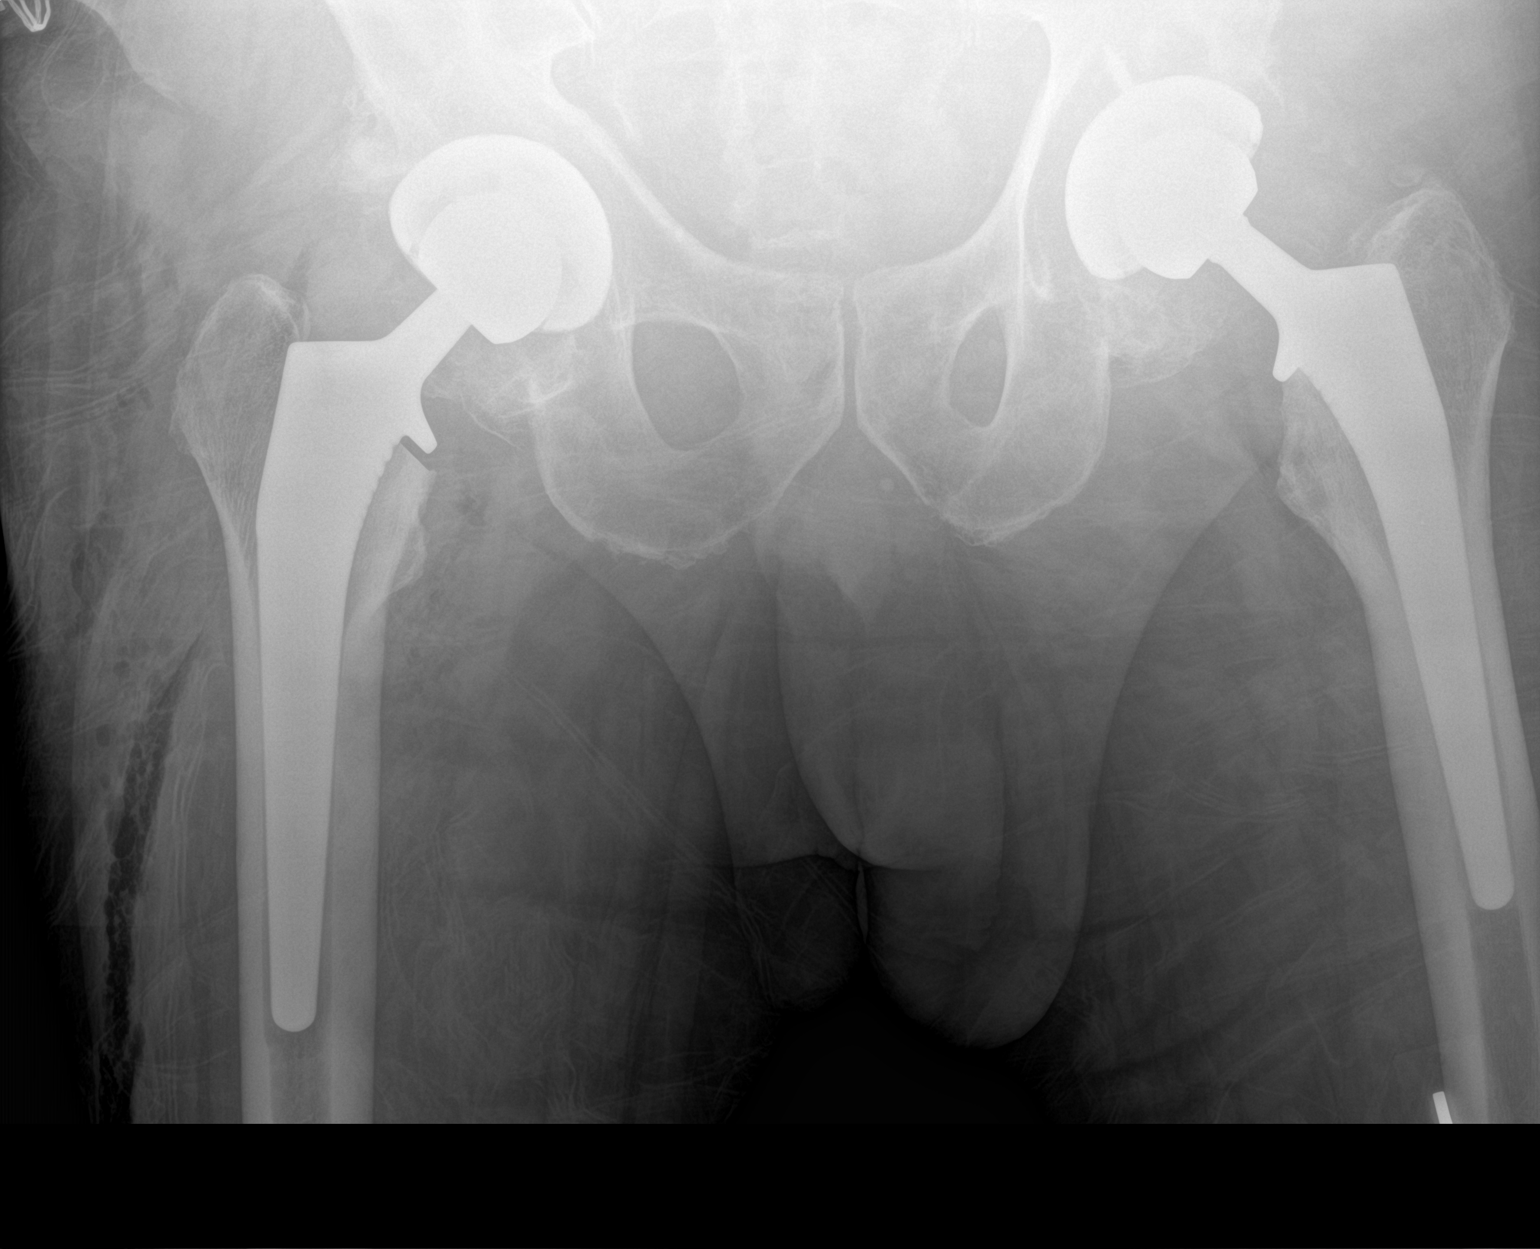

[1 of 1 positions shown; findings below may reference images not displayed]

FINDINGS: Previous left hip replacement with normal alignment. Pubic symphysis
and rami are intact. Interval right hip replacement with normal
alignment and no fracture. Gas in the soft tissues of the hip and
thigh.
IMPRESSION: Status post right hip replacement with expected postsurgical
changes.

## 2020-05-01 IMAGING — RF DG C-ARM 61-120 MIN
1 series · 4 of 4 positions shown · non-contrast
Comparison: No recent prior.

CLINICAL DATA: Right hip arthroplasty.

EXAM:
OPERATIVE RIGHT HIP (WITH PELVIS IF PERFORMED) 4 VIEWS
TECHNIQUE: Fluoroscopic spot image(s) were submitted for interpretation
post-operatively.

[Series 1: run · 4 of 4 slices shown]
[im 1/4]
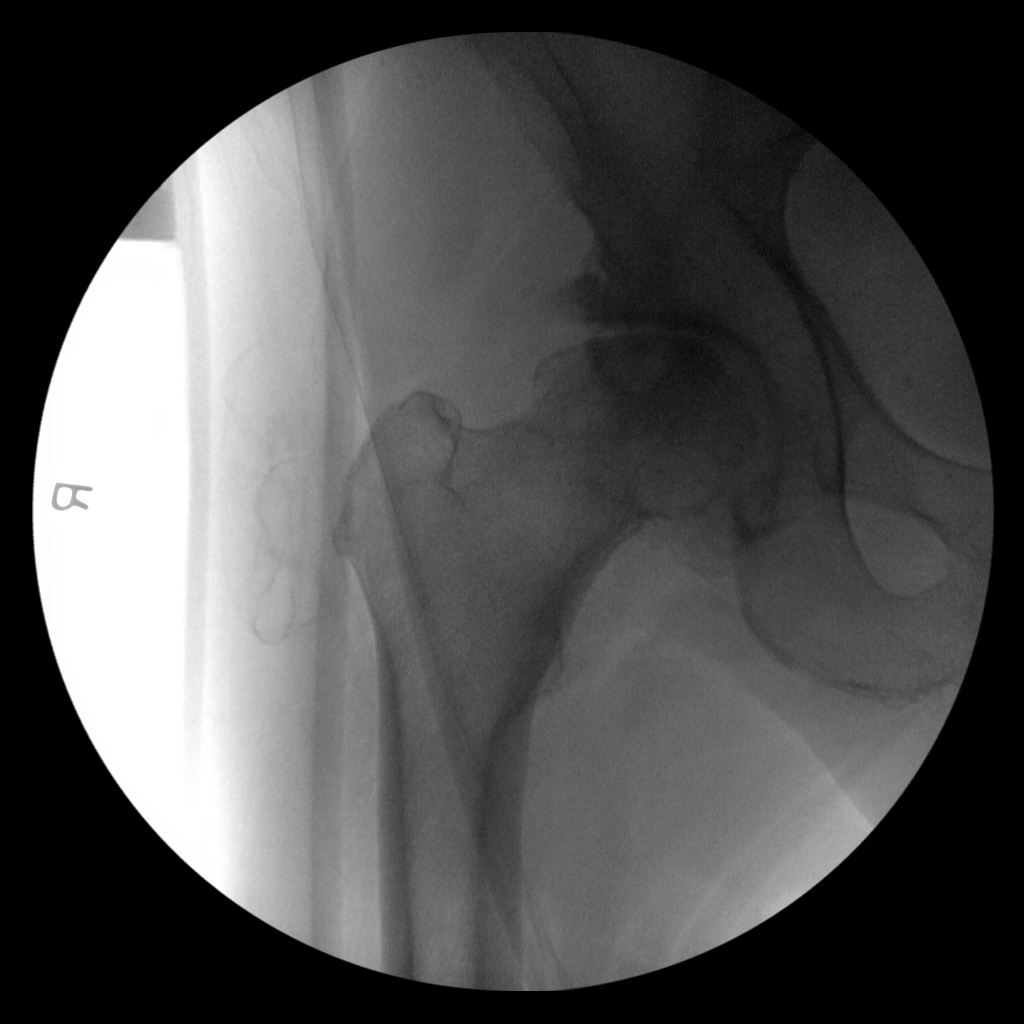
[im 2/4]
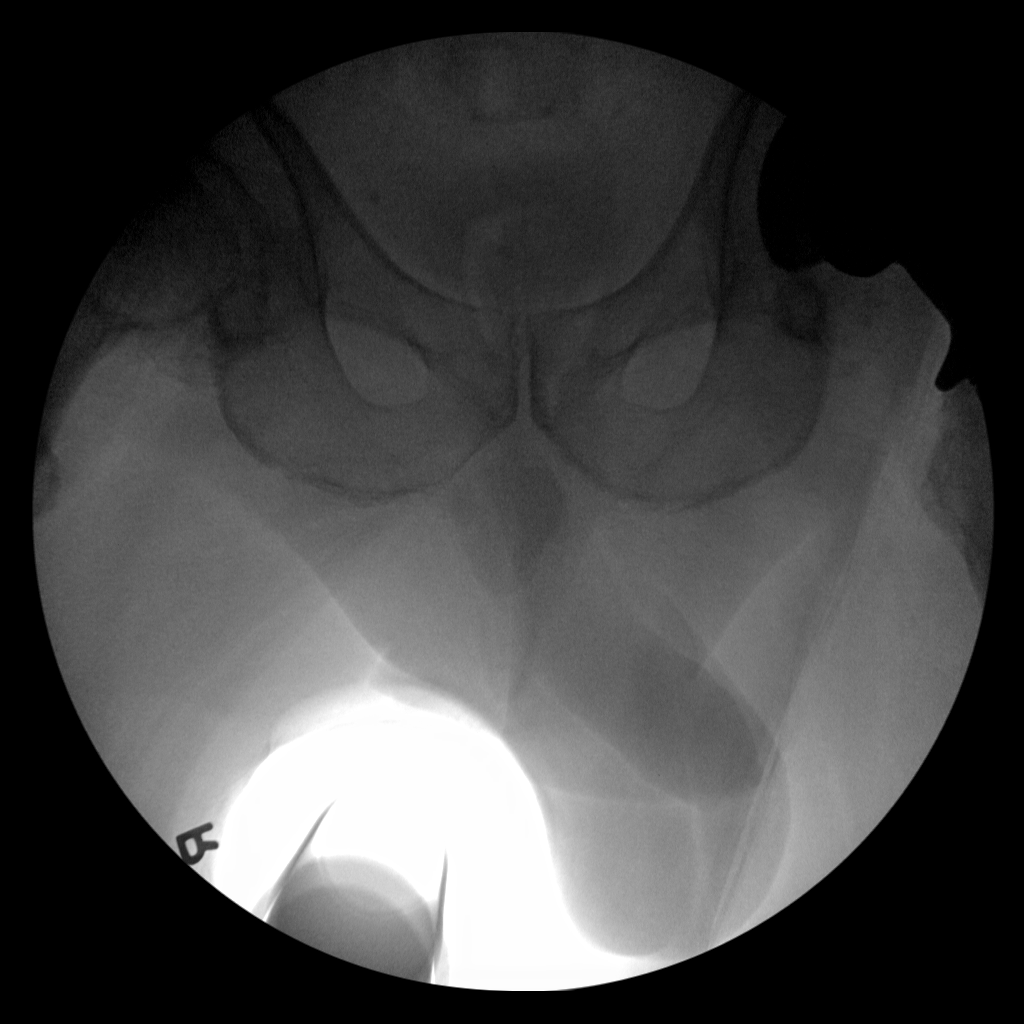
[im 3/4]
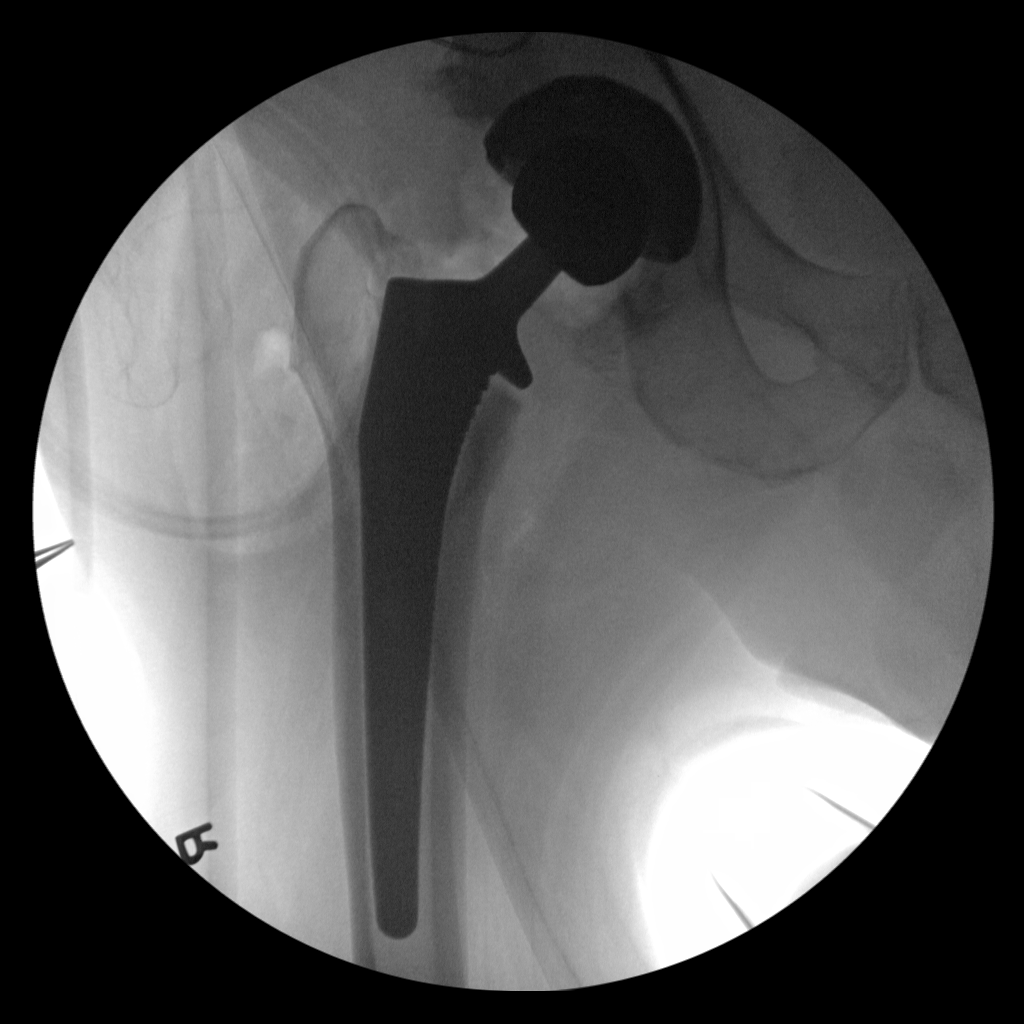
[im 4/4]
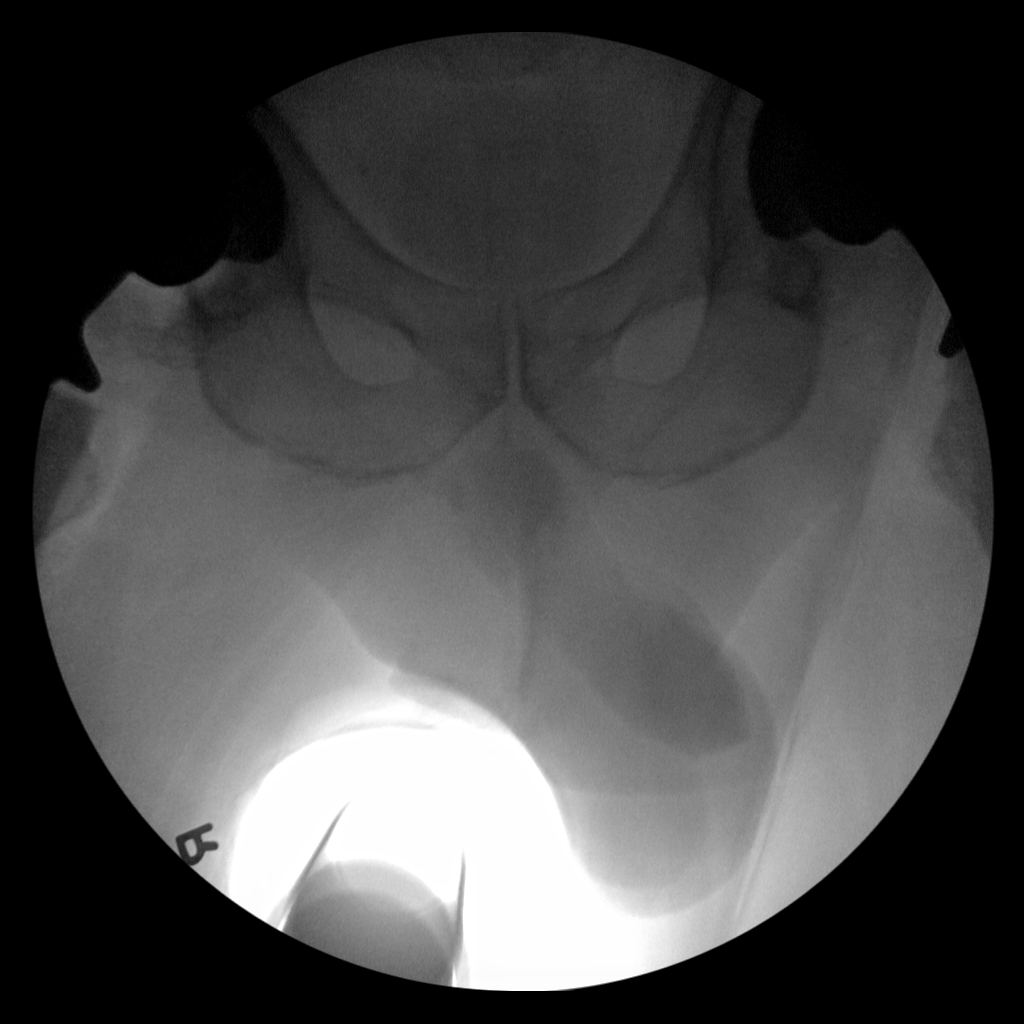

[4 of 4 positions shown; findings below may reference images not displayed]

FINDINGS: Total right hip replacement with anatomic alignment. No acute bony
abnormality.
IMPRESSION: Total right hip replacement with anatomic alignment.

## 2022-01-04 ENCOUNTER — Ambulatory Visit: Payer: Medicare HMO | Admitting: Orthopaedic Surgery

## 2022-01-04 ENCOUNTER — Ambulatory Visit (INDEPENDENT_AMBULATORY_CARE_PROVIDER_SITE_OTHER): Payer: Medicare HMO

## 2022-01-04 ENCOUNTER — Encounter: Payer: Self-pay | Admitting: Orthopaedic Surgery

## 2022-01-04 DIAGNOSIS — Z96643 Presence of artificial hip joint, bilateral: Secondary | ICD-10-CM
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
